# Patient Record
Sex: Male | Born: 1980 | Race: Black or African American | Hispanic: No | Marital: Single | Smoking: Current every day smoker
Health system: Southern US, Community
[De-identification: ages and names within clinical notes are randomized; demographics above are authoritative.]

## PROBLEM LIST (undated history)

## (undated) DIAGNOSIS — J302 Other seasonal allergic rhinitis: Secondary | ICD-10-CM

## (undated) HISTORY — DX: Other seasonal allergic rhinitis: J30.2

---

## 2004-11-20 ENCOUNTER — Emergency Department: Payer: Self-pay | Admitting: Internal Medicine

## 2005-06-28 ENCOUNTER — Emergency Department: Payer: Self-pay | Admitting: Internal Medicine

## 2005-07-06 ENCOUNTER — Emergency Department: Payer: Self-pay | Admitting: Emergency Medicine

## 2006-02-07 ENCOUNTER — Emergency Department: Payer: Self-pay | Admitting: Emergency Medicine

## 2006-08-22 ENCOUNTER — Emergency Department: Payer: Self-pay | Admitting: Emergency Medicine

## 2006-08-24 ENCOUNTER — Emergency Department: Payer: Self-pay | Admitting: Emergency Medicine

## 2006-10-21 ENCOUNTER — Emergency Department (HOSPITAL_COMMUNITY): Admission: EM | Admit: 2006-10-21 | Discharge: 2006-10-21 | Payer: Self-pay | Admitting: Emergency Medicine

## 2007-07-25 ENCOUNTER — Emergency Department: Payer: Self-pay | Admitting: Emergency Medicine

## 2007-08-22 ENCOUNTER — Emergency Department: Payer: Self-pay | Admitting: Emergency Medicine

## 2007-08-26 ENCOUNTER — Emergency Department: Payer: Self-pay | Admitting: Emergency Medicine

## 2008-04-05 ENCOUNTER — Emergency Department: Payer: Self-pay | Admitting: Emergency Medicine

## 2008-07-02 ENCOUNTER — Emergency Department: Payer: Self-pay | Admitting: Emergency Medicine

## 2009-01-23 ENCOUNTER — Emergency Department: Payer: Self-pay | Admitting: Internal Medicine

## 2009-05-20 ENCOUNTER — Emergency Department: Payer: Self-pay | Admitting: Internal Medicine

## 2009-08-15 ENCOUNTER — Emergency Department: Payer: Self-pay | Admitting: Emergency Medicine

## 2009-12-26 ENCOUNTER — Emergency Department: Payer: Self-pay | Admitting: Emergency Medicine

## 2010-07-08 ENCOUNTER — Emergency Department: Payer: Self-pay | Admitting: Emergency Medicine

## 2010-09-22 ENCOUNTER — Emergency Department: Payer: Self-pay | Admitting: Emergency Medicine

## 2010-11-03 ENCOUNTER — Emergency Department: Payer: Self-pay | Admitting: Emergency Medicine

## 2011-01-09 ENCOUNTER — Emergency Department: Payer: Self-pay | Admitting: Emergency Medicine

## 2011-04-26 ENCOUNTER — Emergency Department: Payer: Self-pay | Admitting: Internal Medicine

## 2011-11-10 ENCOUNTER — Emergency Department: Payer: Self-pay | Admitting: Emergency Medicine

## 2011-12-09 ENCOUNTER — Emergency Department: Payer: Self-pay | Admitting: Emergency Medicine

## 2013-06-14 ENCOUNTER — Emergency Department: Payer: Self-pay | Admitting: Emergency Medicine

## 2013-06-14 LAB — URINALYSIS, COMPLETE
Bilirubin,UR: NEGATIVE
Ketone: NEGATIVE
Protein: 100
RBC,UR: 5578 /HPF (ref 0–5)
Specific Gravity: 1.014 (ref 1.003–1.030)
WBC UR: 27 /HPF (ref 0–5)

## 2013-06-14 LAB — GC/CHLAMYDIA PROBE AMP

## 2013-06-17 LAB — URINE CULTURE

## 2013-11-10 ENCOUNTER — Emergency Department: Payer: Self-pay | Admitting: Emergency Medicine

## 2013-11-10 LAB — URINALYSIS, COMPLETE
Bilirubin,UR: NEGATIVE
Blood: NEGATIVE
Glucose,UR: NEGATIVE mg/dL (ref 0–75)
KETONE: NEGATIVE
Leukocyte Esterase: NEGATIVE
NITRITE: NEGATIVE
Ph: 5 (ref 4.5–8.0)
Protein: 100
RBC,UR: 10 /HPF (ref 0–5)
Specific Gravity: 1.039 (ref 1.003–1.030)
Squamous Epithelial: <1

## 2013-11-10 LAB — CBC WITH DIFFERENTIAL/PLATELET
Basophil #: 0 10*3/uL (ref 0.0–0.1)
Basophil %: 0.5 %
EOS PCT: 0.4 %
Eosinophil #: 0 10*3/uL (ref 0.0–0.7)
HCT: 44.6 % (ref 40.0–52.0)
HGB: 14.3 g/dL (ref 13.0–18.0)
LYMPHS ABS: 1.1 10*3/uL (ref 1.0–3.6)
Lymphocyte %: 14.3 %
MCH: 23.8 pg — ABNORMAL LOW (ref 26.0–34.0)
MCHC: 32.1 g/dL (ref 32.0–36.0)
MCV: 74 fL — AB (ref 80–100)
MONO ABS: 1.6 x10 3/mm — AB (ref 0.2–1.0)
MONOS PCT: 20.4 %
Neutrophil #: 5.1 10*3/uL (ref 1.4–6.5)
Neutrophil %: 64.4 %
Platelet: 171 10*3/uL (ref 150–440)
RBC: 6 10*6/uL — ABNORMAL HIGH (ref 4.40–5.90)
RDW: 16.3 % — AB (ref 11.5–14.5)
WBC: 7.8 10*3/uL (ref 3.8–10.6)

## 2013-11-10 LAB — COMPREHENSIVE METABOLIC PANEL
ALT: 41 U/L (ref 12–78)
Albumin: 3.8 g/dL (ref 3.4–5.0)
Alkaline Phosphatase: 99 U/L
Anion Gap: 5 — ABNORMAL LOW (ref 7–16)
BUN: 13 mg/dL (ref 7–18)
Bilirubin,Total: 0.2 mg/dL (ref 0.2–1.0)
CHLORIDE: 106 mmol/L (ref 98–107)
CO2: 22 mmol/L (ref 21–32)
Calcium, Total: 8.2 mg/dL — ABNORMAL LOW (ref 8.5–10.1)
Creatinine: 1.12 mg/dL (ref 0.60–1.30)
EGFR (African American): >60
EGFR (Non-African Amer.): >60
Glucose: 99 mg/dL (ref 65–99)
Osmolality: 267 (ref 275–301)
POTASSIUM: 3.7 mmol/L (ref 3.5–5.1)
SGOT(AST): 50 U/L — ABNORMAL HIGH (ref 15–37)
SODIUM: 133 mmol/L — AB (ref 136–145)
TOTAL PROTEIN: 8.3 g/dL — AB (ref 6.4–8.2)

## 2013-11-10 LAB — LIPASE, BLOOD: Lipase: 157 U/L (ref 73–393)

## 2014-01-08 ENCOUNTER — Emergency Department: Payer: Self-pay | Admitting: Emergency Medicine

## 2014-08-18 ENCOUNTER — Emergency Department: Payer: Self-pay | Admitting: Emergency Medicine

## 2016-04-02 ENCOUNTER — Encounter: Payer: Self-pay | Admitting: *Deleted

## 2016-04-02 ENCOUNTER — Emergency Department
Admission: EM | Admit: 2016-04-02 | Discharge: 2016-04-02 | Disposition: A | Discharge status: Home / Self Care | Payer: 59 | Attending: Emergency Medicine | Admitting: Emergency Medicine

## 2016-04-02 DIAGNOSIS — Y939 Activity, unspecified: Secondary | ICD-10-CM | POA: Insufficient documentation

## 2016-04-02 DIAGNOSIS — Y929 Unspecified place or not applicable: Secondary | ICD-10-CM | POA: Diagnosis not present

## 2016-04-02 DIAGNOSIS — Y999 Unspecified external cause status: Secondary | ICD-10-CM | POA: Insufficient documentation

## 2016-04-02 DIAGNOSIS — F17200 Nicotine dependence, unspecified, uncomplicated: Secondary | ICD-10-CM | POA: Insufficient documentation

## 2016-04-02 DIAGNOSIS — S46912A Strain of unspecified muscle, fascia and tendon at shoulder and upper arm level, left arm, initial encounter: Secondary | ICD-10-CM | POA: Diagnosis not present

## 2016-04-02 DIAGNOSIS — X58XXXA Exposure to other specified factors, initial encounter: Secondary | ICD-10-CM | POA: Diagnosis not present

## 2016-04-02 DIAGNOSIS — M25512 Pain in left shoulder: Secondary | ICD-10-CM | POA: Diagnosis present

## 2016-04-02 MED ORDER — NAPROXEN 500 MG PO TABS: 500.0000 mg | ORAL_TABLET | Freq: Two times a day (BID) | ORAL | 2 refills | Status: DC

## 2016-04-02 NOTE — ED Notes (Signed)
See triage note  Having left shoulder pain for about 1 week  Denies any injury  No deformity noted having some pain with touch and movement

## 2016-04-02 NOTE — ED Triage Notes (Signed)
Pt complains of left shoulder pain for 1 week, pt denies chest pain and any other symptoms

## 2016-04-02 NOTE — ED Provider Notes (Signed)
[facility] Emergency Department Provider Note   ____________________________________________    I have reviewed the triage vital signs and the nursing notes.   HISTORY  Chief Complaint Shoulder Pain     HPI Ricky Anderson Sr. is a 35 y.o. male who presents with approximate 1 week of mild left shoulder discomfort. Patient reports he has an aching that runs from his left lateral neck into his left shoulder. He denies trauma to the area. He denies repetitive use. No numbness or tingling. No headache. No erythema or skin break. He is able to move his arm fully but reports it is uncomfortable at times.   History reviewed. No pertinent past medical history.  There are no active problems to display for this patient.   History reviewed. No pertinent surgical history.  Prior to Admission medications   Medication Sig Start Date End Date Taking? Authorizing Provider  naproxen (NAPROSYN) 500 MG tablet Take 1 tablet (500 mg total) by mouth 2 (two) times daily with a meal. 04/02/16   Jene Everyobert Livi Mcgann, MD     Allergies Review of patient's allergies indicates no known allergies.  No family history on file.  Social History Social History  Substance Use Topics  . Smoking status: Current Every Day Smoker    Packs/day: 1.00  . Smokeless tobacco: Never Used  . Alcohol use Yes    Review of Systems  Constitutional: No fever/chills       Musculoskeletal: As above Skin: Negative for rash. Neurological: Negative for headaches     ____________________________________________   PHYSICAL EXAM:  VITAL SIGNS: ED Triage Vitals  Enc Vitals Group     BP 04/02/16 1016 128/90     Pulse Rate 04/02/16 1016 85     Resp 04/02/16 1016 20     Temp 04/02/16 1016 98.1 F (36.7 C)     Temp Source 04/02/16 1016 Oral     SpO2 04/02/16 1016 96 %     Weight 04/02/16 1013 190 lb (86.2 kg)     Height 04/02/16 1013 6' (1.829 m)     Head Circumference --      Peak  Flow --      Pain Score 04/02/16 1013 8     Pain Loc --      Pain Edu? --      Excl. in GC? --      Constitutional: Alert and oriented. No acute distress. Pleasant and interactive Eyes: Conjunctivae are normal.  Head: Atraumatic. Nose: No congestion/rhinnorhea. Mouth/Throat: Mucous membranes are moist.   Cardiovascular: Normal rate, regular rhythm.  Respiratory: Normal respiratory effort.  No retractions. Genitourinary: deferred Musculoskeletal: Patient with mild tenderness to palpation along the left lateral trapezius muscle. Full range of motion both active and passive of the left arm. No clavicular tenderness to palpation. No tenderness to the scapula. 2+ distal pulses. Neurologic:  Normal speech and language. No gross focal neurologic deficits are appreciated.   Skin:  Skin is warm, dry and intact. No rash noted.   ____________________________________________   LABS (all labs ordered are listed, but only abnormal results are displayed)  Labs Reviewed - No data to display ____________________________________________  EKG   ____________________________________________  RADIOLOGY  None ____________________________________________   PROCEDURES  Procedure(s) performed: No    Critical Care performed: No ____________________________________________   INITIAL IMPRESSION / ASSESSMENT AND PLAN / ED COURSE  Pertinent labs & imaging results that were available during my care of the patient were reviewed by me and considered  in my medical decision making (see chart for details).  Patient is examined primarily consistent with trapezius muscle versus shoulder strain. We will treat conservatively with NSAIDs and ice packs. Follow-up with PCP as needed.   ____________________________________________   FINAL CLINICAL IMPRESSION(S) / ED DIAGNOSES  Final diagnoses:  Left shoulder strain, initial encounter      NEW MEDICATIONS STARTED DURING THIS VISIT:  New  Prescriptions   NAPROXEN (NAPROSYN) 500 MG TABLET    Take 1 tablet (500 mg total) by mouth 2 (two) times daily with a meal.     Note:  This document was prepared using Dragon voice recognition software and may include unintentional dictation errors.    Jene Every, MD 04/02/16 1027

## 2016-06-02 ENCOUNTER — Encounter: Payer: Self-pay | Admitting: Intensive Care

## 2016-06-02 ENCOUNTER — Emergency Department: Payer: No Typology Code available for payment source

## 2016-06-02 ENCOUNTER — Emergency Department
Admission: EM | Admit: 2016-06-02 | Discharge: 2016-06-03 | Disposition: A | Discharge status: Home / Self Care | Payer: No Typology Code available for payment source | Attending: Student | Admitting: Student

## 2016-06-02 DIAGNOSIS — Z23 Encounter for immunization: Secondary | ICD-10-CM | POA: Insufficient documentation

## 2016-06-02 DIAGNOSIS — Y939 Activity, unspecified: Secondary | ICD-10-CM | POA: Insufficient documentation

## 2016-06-02 DIAGNOSIS — Y92410 Unspecified street and highway as the place of occurrence of the external cause: Secondary | ICD-10-CM | POA: Insufficient documentation

## 2016-06-02 DIAGNOSIS — S0990XA Unspecified injury of head, initial encounter: Secondary | ICD-10-CM | POA: Insufficient documentation

## 2016-06-02 DIAGNOSIS — S39012A Strain of muscle, fascia and tendon of lower back, initial encounter: Secondary | ICD-10-CM | POA: Diagnosis not present

## 2016-06-02 DIAGNOSIS — Y999 Unspecified external cause status: Secondary | ICD-10-CM | POA: Diagnosis not present

## 2016-06-02 DIAGNOSIS — F17210 Nicotine dependence, cigarettes, uncomplicated: Secondary | ICD-10-CM | POA: Diagnosis not present

## 2016-06-02 DIAGNOSIS — S29012A Strain of muscle and tendon of back wall of thorax, initial encounter: Secondary | ICD-10-CM | POA: Insufficient documentation

## 2016-06-02 DIAGNOSIS — T148XXA Other injury of unspecified body region, initial encounter: Secondary | ICD-10-CM

## 2016-06-02 MED ORDER — OXYCODONE HCL 5 MG PO TABS
5.0000 mg | ORAL_TABLET | Freq: Once | ORAL | Status: AC
  Administered 2016-06-03: 5 mg via ORAL
  Filled 2016-06-02: qty 1

## 2016-06-02 MED ORDER — TETANUS-DIPHTH-ACELL PERTUSSIS 5-2.5-18.5 LF-MCG/0.5 IM SUSP
0.5000 mL | Freq: Once | INTRAMUSCULAR | Status: AC
  Administered 2016-06-03: 0.5 mL via INTRAMUSCULAR
  Filled 2016-06-02: qty 0.5

## 2016-06-02 NOTE — ED Notes (Signed)
Pt at CT
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
## 2016-06-03 DIAGNOSIS — S0990XA Unspecified injury of head, initial encounter: Secondary | ICD-10-CM | POA: Diagnosis not present
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
## 2017-05-20 ENCOUNTER — Emergency Department
Admission: EM | Admit: 2017-05-20 | Discharge: 2017-05-20 | Disposition: A | Discharge status: Home / Self Care | Payer: 59 | Attending: Emergency Medicine | Admitting: Emergency Medicine

## 2017-05-20 ENCOUNTER — Encounter: Payer: Self-pay | Admitting: Emergency Medicine

## 2017-05-20 DIAGNOSIS — R1084 Generalized abdominal pain: Secondary | ICD-10-CM | POA: Insufficient documentation

## 2017-05-20 LAB — URINALYSIS, COMPLETE (UACMP) WITH MICROSCOPIC
BILIRUBIN URINE: NEGATIVE
Glucose, UA: NEGATIVE mg/dL
Ketones, ur: NEGATIVE mg/dL
Leukocytes, UA: NEGATIVE
Nitrite: NEGATIVE
Protein, ur: 30 mg/dL — AB
SQUAMOUS EPITHELIAL / LPF: NONE SEEN
Specific Gravity, Urine: 1.01 (ref 1.005–1.030)
pH: 6 (ref 5.0–8.0)
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
## 2017-09-09 ENCOUNTER — Other Ambulatory Visit: Payer: Self-pay

## 2017-09-09 ENCOUNTER — Emergency Department
Admission: EM | Admit: 2017-09-09 | Discharge: 2017-09-09 | Disposition: A | Discharge status: Home / Self Care | Payer: 59 | Attending: Emergency Medicine | Admitting: Emergency Medicine

## 2017-09-09 ENCOUNTER — Encounter: Payer: Self-pay | Admitting: Emergency Medicine

## 2017-09-09 DIAGNOSIS — R300 Dysuria: Secondary | ICD-10-CM | POA: Insufficient documentation

## 2017-09-09 DIAGNOSIS — R109 Unspecified abdominal pain: Secondary | ICD-10-CM

## 2017-09-09 DIAGNOSIS — R1031 Right lower quadrant pain: Secondary | ICD-10-CM | POA: Diagnosis not present

## 2017-09-09 DIAGNOSIS — F17210 Nicotine dependence, cigarettes, uncomplicated: Secondary | ICD-10-CM | POA: Diagnosis not present

## 2017-09-09 LAB — URINALYSIS, COMPLETE (UACMP) WITH MICROSCOPIC
BILIRUBIN URINE: NEGATIVE
Bacteria, UA: NONE SEEN
Glucose, UA: NEGATIVE mg/dL
HGB URINE DIPSTICK: NEGATIVE
Ketones, ur: NEGATIVE mg/dL
Leukocytes, UA: NEGATIVE
NITRITE: NEGATIVE
Protein, ur: NEGATIVE mg/dL
RBC / HPF: NONE SEEN RBC/hpf (ref 0–5)
SPECIFIC GRAVITY, URINE: 1.01 (ref 1.005–1.030)
Squamous Epithelial / LPF: NONE SEEN
pH: 6 (ref 5.0–8.0)

## 2017-09-09 LAB — CHLAMYDIA/NGC RT PCR (ARMC ONLY)
Chlamydia Tr: NOT DETECTED
N gonorrhoeae: NOT DETECTED

## 2017-09-09 NOTE — Discharge Instructions (Signed)
Fortunately today your urine test looked very normal.  I will call you back with the results of the other test we discussed.  Please make sure you remain well-hydrated and establish care with primary care for reevaluation.  Return to the emergency department for any concerns.  It was a pleasure to take care of you today, and thank you for coming to our emergency department.  If you have any questions or concerns before leaving please ask the nurse to grab me and I'm more than happy to go through your aftercare instructions again.  If you were prescribed any opioid pain medication today such as Norco, Vicodin, Percocet, morphine, hydrocodone, or oxycodone please make sure you do not drive when you are taking this medication as it can alter your ability to drive safely.  If you have any concerns once you are home that you are not improving or are in fact getting worse before you can make it to your follow-up appointment, please do not hesitate to call 911 and come back for further evaluation.  Merrily BrittleNeil Curley Fayette, MD  Results for orders placed or performed during the hospital encounter of 09/09/17  Urinalysis, Complete w Microscopic  Result Value Ref Range   Color, Urine STRAW (A) YELLOW   APPearance CLEAR (A) CLEAR   Specific Gravity, Urine 1.010 1.005 - 1.030   pH 6.0 5.0 - 8.0   Glucose, UA NEGATIVE NEGATIVE mg/dL   Hgb urine dipstick NEGATIVE NEGATIVE   Bilirubin Urine NEGATIVE NEGATIVE   Ketones, ur NEGATIVE NEGATIVE mg/dL   Protein, ur NEGATIVE NEGATIVE mg/dL   Nitrite NEGATIVE NEGATIVE   Leukocytes, UA NEGATIVE NEGATIVE   RBC / HPF NONE SEEN 0 - 5 RBC/hpf   WBC, UA 0-5 0 - 5 WBC/hpf   Bacteria, UA NONE SEEN NONE SEEN   Squamous Epithelial / LPF NONE SEEN NONE SEEN

## 2017-09-09 NOTE — ED Provider Notes (Addendum)
[facility] Emergency Department Provider Note  ____________________________________________   First MD Initiated Contact with Patient 09/09/17 [phone]     (approximate)  I have reviewed the triage vital signs and the nursing notes.   HISTORY  Chief Complaint Flank Pain   HPI Ricky Anderson. is a 37 y.o. male who self presents to the emergency department with roughly 1 week of intermittent mild severity dysuria and urinary frequency.  Is not every time he urinates.  He also has about 24 hours of insidious onset intermittent mild severity right low back throbbing discomfort.  He denies hematuria.  He says that he did have a urinary tract infection 2-3 years ago that cleared up with antibiotics.  He is sexually monogamous with one partner.  He does not use condoms.  He says he has never had a sexually transmitted disease.  He does have some mild throbbing in his bilateral testicles intermittently.  He denies fevers or chills.  He denies penile discharge.  He denies abdominal pain nausea or vomiting.  History reviewed. No pertinent past medical history.  There are no active problems to display for this patient.   History reviewed. No pertinent surgical history.  Prior to Admission medications   Medication Sig Start Date End Date Taking? Authorizing Provider  naproxen (NAPROSYN) 500 MG tablet Take 1 tablet (500 mg total) by mouth 2 (two) times daily with a meal. 04/02/16   Jene Every, MD    Allergies Patient has no known allergies.  History reviewed. No pertinent family history.  Social History Social History   Tobacco Use  . Smoking status: Current Every Day Smoker    Packs/day: 0.50    Types: Cigarettes  . Smokeless tobacco: Never Used  Substance Use Topics  . Alcohol use: Yes    Alcohol/week: 1.2 oz    Types: 2 Cans of beer per week    Comment: per day, liquor on the weekends  . Drug use: No    Review of Systems Constitutional: No  fever/chills ENT: No sore throat. Cardiovascular: Denies chest pain. Respiratory: Denies shortness of breath. Gastrointestinal: No abdominal pain.  No nausea, no vomiting.  No diarrhea.  No constipation. Musculoskeletal: Negative for back pain. Neurological: Negative for headaches   ____________________________________________   PHYSICAL EXAM:  VITAL SIGNS: ED Triage Vitals  Enc Vitals Group     BP 09/09/17 0751 (!) 155/91     Pulse Rate 09/09/17 0751 87     Resp 09/09/17 0751 18     Temp 09/09/17 0751 98 F (36.7 C)     Temp Source 09/09/17 0751 Oral     SpO2 09/09/17 0751 100 %     Weight 09/09/17 0751 190 lb (86.2 kg)     Height 09/09/17 0751 6' (1.829 m)     Head Circumference --      Peak Flow --      Pain Score 09/09/17 0750 7     Pain Loc --      Pain Edu? --      Excl. in GC? --     Constitutional: Alert and oriented x4 well-appearing nontoxic no diaphoresis speaks in full clear sentences Head: Atraumatic. Nose: No congestion/rhinnorhea. Mouth/Throat: No trismus Neck: No stridor.   Cardiovascular: Regular rate and rhythm Respiratory: Normal respiratory effort.  No retractions. Gastrointestinal: Soft nondistended nontender no rebound or guarding no peritonitis no costovertebral tenderness Normal testes and normal line no tenderness circumcised phallus with no discharge Neurologic:  Normal speech  and language. No gross focal neurologic deficits are appreciated.  Skin:  Skin is warm, dry and intact. No rash noted.    ____________________________________________  LABS (all labs ordered are listed, but only abnormal results are displayed)  Labs Reviewed  URINALYSIS, COMPLETE (UACMP) WITH MICROSCOPIC - Abnormal; Notable for the following components:      Result Value   Color, Urine STRAW (*)    APPearance CLEAR (*)    All other components within normal limits  CHLAMYDIA/NGC RT PCR ([facility] ONLY)    Urinalysis reviewed by me with no acute  disease __________________________________________  EKG   ____________________________________________  RADIOLOGY   ____________________________________________   DIFFERENTIAL includes but not limited to  Gonococcal urethritis, chlamydia urethritis, urinary tract infection, pyelonephritis, prostatitis   PROCEDURES  Procedure(s) performed: no  Procedures  Critical Care performed: no  Observation: no ____________________________________________   INITIAL IMPRESSION / ASSESSMENT AND PLAN / ED COURSE  Pertinent labs & imaging results that were available during my care of the patient were reviewed by me and considered in my medical decision making (see chart for details).  The patient is hemodynamically stable quite well-appearing.  His lower urinary tract symptoms are more consistent with a sexually transmitted infection than a true UTI.     ----------------------------------------- 9:02 AM on 09/09/2017 -----------------------------------------  Fortunately the patient's urinalysis is very normal.  I had a lengthy discussion with the patient regarding the diagnostic uncertainty but that I did not believe he required antibiotics at this time.  I will call him back with the results of the GC/CT.  He is discharged home in good condition verbalizes understanding and agree with plan. ____________________________________________   FINAL CLINICAL IMPRESSION(S) / ED DIAGNOSES  Final diagnoses:  Dysuria  Right flank pain      NEW MEDICATIONS STARTED DURING THIS VISIT:  New Prescriptions   No medications on file     Note:  This document was prepared using Dragon voice recognition software and may include unintentional dictation errors.      Merrily Brittleifenbark, Shallon Yaklin, MD 09/09/17 [id number]    Merrily Brittleifenbark, Nichole Keltner, MD 09/09/17 [phone]

## 2017-09-09 NOTE — ED Notes (Signed)

## 2017-09-09 NOTE — ED Triage Notes (Signed)
Pt c/o R flank pain. Pt also c/o intermittent dysuria at this time. Pt denies discharge from penile area. Pt states hx of UTI with same symptoms.

## 2017-09-18 ENCOUNTER — Other Ambulatory Visit: Payer: Self-pay

## 2017-09-18 ENCOUNTER — Encounter: Payer: Self-pay | Admitting: Emergency Medicine

## 2017-09-18 ENCOUNTER — Emergency Department
Admission: EM | Admit: 2017-09-18 | Discharge: 2017-09-18 | Disposition: A | Discharge status: Home / Self Care | Payer: 59 | Attending: Emergency Medicine | Admitting: Emergency Medicine

## 2017-09-18 DIAGNOSIS — M791 Myalgia, unspecified site: Secondary | ICD-10-CM | POA: Diagnosis not present

## 2017-09-18 DIAGNOSIS — R51 Headache: Secondary | ICD-10-CM | POA: Diagnosis not present

## 2017-09-18 DIAGNOSIS — J101 Influenza due to other identified influenza virus with other respiratory manifestations: Secondary | ICD-10-CM | POA: Insufficient documentation

## 2017-09-18 DIAGNOSIS — F17210 Nicotine dependence, cigarettes, uncomplicated: Secondary | ICD-10-CM | POA: Diagnosis not present

## 2017-09-18 LAB — INFLUENZA PANEL BY PCR (TYPE A & B)
Influenza A By PCR: POSITIVE — AB
Influenza B By PCR: NEGATIVE

## 2017-09-18 MED ORDER — OSELTAMIVIR PHOSPHATE 75 MG PO CAPS: 75.0000 mg | ORAL_CAPSULE | Freq: Two times a day (BID) | ORAL | 0 refills | Status: AC

## 2017-09-18 NOTE — ED Triage Notes (Signed)
States he developed body aches and fever yesterday   Unsure of how high the fever was

## 2017-09-18 NOTE — ED Provider Notes (Signed)
[facility] Emergency Department Provider Note  ____________________________________________   First MD Initiated Contact with Patient 09/18/17 1209     (approximate)  I have reviewed the triage vital signs and the nursing notes.   HISTORY  Chief Complaint Generalized Body Aches   HPI Ricky Anderson. is a 37 y.o. male is here with complaint of sudden onset of body aches and fever yesterday.  Patient his temperature at home is unsure how high it was.  He states he did not get the flu shot this year.  He is a current smoker.  He is unaware of any exposure to the flu.   History reviewed. No pertinent past medical history.  There are no active problems to display for this patient.   History reviewed. No pertinent surgical history.  Prior to Admission medications   Medication Sig Start Date End Date Taking? Authorizing Provider  oseltamivir (TAMIFLU) 75 MG capsule Take 1 capsule (75 mg total) by mouth 2 (two) times daily for 5 days. 09/18/17 09/23/17  Tommi Rumps, PA-C    Allergies Patient has no known allergies.  No family history on file.  Social History Social History   Tobacco Use  . Smoking status: Current Every Day Smoker    Packs/day: 0.50    Types: Cigarettes  . Smokeless tobacco: Never Used  Substance Use Topics  . Alcohol use: Yes    Alcohol/week: 1.2 oz    Types: 2 Cans of beer per week    Comment: per day, liquor on the weekends  . Drug use: No    Review of Systems Constitutional: Subjective fever/chills Eyes: No visual changes. ENT: No sore throat. Cardiovascular: Denies chest pain. Respiratory: Denies shortness of breath. Gastrointestinal: No abdominal pain.  No nausea, no vomiting.  No diarrhea.  Musculoskeletal: Positive for body aches. Skin: Negative for rash. Neurological: Positive for headaches, no focal weakness or numbness. ___________________________________________   PHYSICAL EXAM:  VITAL SIGNS: ED  Triage Vitals  Enc Vitals Group     BP 09/18/17 1133 129/82     Pulse Rate 09/18/17 1133 91     Resp --      Temp 09/18/17 1133 98.6 F (37 C)     Temp Source 09/18/17 1133 Oral     SpO2 09/18/17 1133 99 %     Weight 09/18/17 1141 190 lb (86.2 kg)     Height 09/18/17 1141 6' (1.829 m)     Head Circumference --      Peak Flow --      Pain Score --      Pain Loc --      Pain Edu? --      Excl. in GC? --    Constitutional: Alert and oriented. Well appearing and in no acute distress. Eyes: Conjunctivae are normal.  Head: Atraumatic. Nose: Mild congestion/rhinnorhea. Mouth/Throat: Mucous membranes are moist.  Oropharynx non-erythematous.   Neck: No stridor.   Hematological/Lymphatic/Immunilogical: No cervical lymphadenopathy. Cardiovascular: Normal rate, regular rhythm. Grossly normal heart sounds.  Good peripheral circulation. Respiratory: Normal respiratory effort.  No retractions. Lungs CTAB. Musculoskeletal: Moves upper and lower extremities without any difficulty. Neurologic:  Normal speech and language. No gross focal neurologic deficits are appreciated. No gait instability. Skin:  Skin is warm, dry and intact. No rash noted. Psychiatric: Mood and affect are normal. Speech and behavior are normal.  ____________________________________________   LABS (all labs ordered are listed, but only abnormal results are displayed)  Labs Reviewed  INFLUENZA PANEL  BY PCR (TYPE A & B) - Abnormal; Notable for the following components:      Result Value   Influenza A By PCR POSITIVE (*)    All other components within normal limits     PROCEDURES  Procedure(s) performed: None  Procedures  Critical Care performed: No  ____________________________________________   INITIAL IMPRESSION / ASSESSMENT AND PLAN / ED COURSE Patient was made aware that he was positive for influenza A.  He was given a prescription for Tamiflu to begin taking day.  He will  increase fluids, take Tylenol  or ibuprofen as needed for fever and body aches.  He will need to follow-up with [facility] if any continued problems.  He was also given a note to remain out of work.  ____________________________________________   FINAL CLINICAL IMPRESSION(S) / ED DIAGNOSES  Final diagnoses:  Influenza A     ED Discharge Orders        Ordered    oseltamivir (TAMIFLU) 75 MG capsule  2 times daily     09/18/17 1301       Note:  This document was prepared using Dragon voice recognition software and may include unintentional dictation errors.    Tommi RumpsSummers, Rhonda L, PA-C 09/18/17 1304    Tommi RumpsSummers, Rhonda L, PA-C 09/18/17 1404    Schaevitz, Myra Rudeavid Matthew, MD 09/18/17 [phone]

## 2017-09-18 NOTE — Discharge Instructions (Signed)
Follow-up with [facility] if any continued problems.  Increase fluids.  Tylenol or ibuprofen as needed for fever or body aches.  Begin taking Tamiflu twice daily for the next 5 days.

## 2017-10-08 ENCOUNTER — Ambulatory Visit (INDEPENDENT_AMBULATORY_CARE_PROVIDER_SITE_OTHER): Payer: 59 | Admitting: Family Medicine

## 2017-10-08 ENCOUNTER — Encounter: Payer: Self-pay | Admitting: Family Medicine

## 2017-10-08 VITALS — BP 138/84 | HR 97 | Temp 98.1°F | Wt 201.6 lb

## 2017-10-08 DIAGNOSIS — J0110 Acute frontal sinusitis, unspecified: Secondary | ICD-10-CM

## 2017-10-08 DIAGNOSIS — F419 Anxiety disorder, unspecified: Secondary | ICD-10-CM

## 2017-10-08 DIAGNOSIS — Z7689 Persons encountering health services in other specified circumstances: Secondary | ICD-10-CM

## 2017-10-08 DIAGNOSIS — J309 Allergic rhinitis, unspecified: Secondary | ICD-10-CM | POA: Diagnosis not present

## 2017-10-08 MED ORDER — AMOXICILLIN-POT CLAVULANATE 875-125 MG PO TABS: 1.0000 | ORAL_TABLET | Freq: Two times a day (BID) | ORAL | 0 refills | Status: DC

## 2017-10-08 MED ORDER — FLUTICASONE PROPIONATE 50 MCG/ACT NA SUSP: 2.0000 | Freq: Two times a day (BID) | NASAL | 11 refills | Status: DC

## 2017-10-08 MED ORDER — LEVOCETIRIZINE DIHYDROCHLORIDE 5 MG PO TABS: 5.0000 mg | ORAL_TABLET | Freq: Every day | ORAL | 11 refills | Status: DC

## 2017-10-08 NOTE — Patient Instructions (Addendum)
Counseling is always a great option to help with frequent anxiety episodes - you can go to [employer] and type in your zip code and health insurance to find local providers you would be covered to see   Buspirone tablets What is this medicine? BUSPIRONE (byoo SPYE rone) is used to treat anxiety disorders. This medicine may be used for other purposes; ask your health care provider or pharmacist if you have questions. COMMON BRAND NAME(S): BuSpar What should I tell my health care provider before I take this medicine? They need to know if you have any of these conditions: -kidney or liver disease -an unusual or allergic reaction to buspirone, other medicines, foods, dyes, or preservatives -pregnant or trying to get pregnant -breast-feeding How should I use this medicine? Take this medicine by mouth with a glass of water. Follow the directions on the prescription label. You may take this medicine with or without food. To ensure that this medicine always works the same way for you, you should take it either always with or always without food. Take your doses at regular intervals. Do not take your medicine more often than directed. Do not stop taking except on the advice of your doctor or health care professional. Talk to your pediatrician regarding the use of this medicine in children. Special care may be needed. Overdosage: If you think you have taken too much of this medicine contact a poison control center or emergency room at once. NOTE: This medicine is only for you. Do not share this medicine with others. What if I miss a dose? If you miss a dose, take it as soon as you can. If it is almost time for your next dose, take only that dose. Do not take double or extra doses. What may interact with this medicine? Do not take this medicine with any of the following medications: -linezolid -MAOIs like Carbex, Eldepryl, Marplan, Nardil, and Parnate -methylene blue -procarbazine This medicine  may also interact with the following medications: -diazepam -digoxin -diltiazem -erythromycin -grapefruit juice -haloperidol -medicines for mental depression or mood problems -medicines for seizures like carbamazepine, phenobarbital and phenytoin -nefazodone -other medications for anxiety -rifampin -ritonavir -some antifungal medicines like itraconazole, ketoconazole, and voriconazole -verapamil -warfarin This list may not describe all possible interactions. Give your health care provider a list of all the medicines, herbs, non-prescription drugs, or dietary supplements you use. Also tell them if you smoke, drink alcohol, or use illegal drugs. Some items may interact with your medicine. What should I watch for while using this medicine? Visit your doctor or health care professional for regular checks on your progress. It may take 1 to 2 weeks before your anxiety gets better. You may get drowsy or dizzy. Do not drive, use machinery, or do anything that needs mental alertness until you know how this drug affects you. Do not stand or sit up quickly, especially if you are an older patient. This reduces the risk of dizzy or fainting spells. Alcohol can make you more drowsy and dizzy. Avoid alcoholic drinks. What side effects may I notice from receiving this medicine? Side effects that you should report to your doctor or health care professional as soon as possible: -blurred vision or other vision changes -chest pain -confusion -difficulty breathing -feelings of hostility or anger -muscle aches and pains -numbness or tingling in hands or feet -ringing in the ears -skin rash and itching -vomiting -weakness Side effects that usually do not require medical attention (report to your doctor or health  care professional if they continue or are bothersome): -disturbed dreams, nightmares -headache -nausea -restlessness or nervousness -sore throat and nasal congestion -stomach upset This list  may not describe all possible side effects. Call your doctor for medical advice about side effects. You may report side effects to FDA at 1-800-FDA-1088. Where should I keep my medicine? Keep out of the reach of children. Store at room temperature below 30 degrees C (86 degrees F). Protect from light. Keep container tightly closed. Throw away any unused medicine after the expiration date. NOTE: This sheet is a summary. It may not cover all possible information. If you have questions about this medicine, talk to your doctor, pharmacist, or health care provider.  2018 Elsevier/Gold Standard (2010-03-17 18:06:11) Fluoxetine capsules or tablets (Depression/Mood Disorders) What is this medicine? FLUOXETINE (floo OX e teen) belongs to a class of drugs known as selective serotonin reuptake inhibitors (SSRIs). It helps to treat mood problems such as depression, obsessive compulsive disorder, and panic attacks. It can also treat certain eating disorders. This medicine may be used for other purposes; ask your health care provider or pharmacist if you have questions. COMMON BRAND NAME(S): Prozac What should I tell my health care provider before I take this medicine? They need to know if you have any of these conditions: -bipolar disorder or a family history of bipolar disorder -bleeding disorders -glaucoma -heart disease -liver disease -low levels of sodium in the blood -seizures -suicidal thoughts, plans, or attempt; a previous suicide attempt by you or a family member -take MAOIs like Carbex, Eldepryl, Marplan, Nardil, and Parnate -take medicines that treat or prevent blood clots -thyroid disease -an unusual or allergic reaction to fluoxetine, other medicines, foods, dyes, or preservatives -pregnant or trying to get pregnant -breast-feeding How should I use this medicine? Take this medicine by mouth with a glass of water. Follow the directions on the prescription label. You can take this medicine  with or without food. Take your medicine at regular intervals. Do not take it more often than directed. Do not stop taking this medicine suddenly except upon the advice of your doctor. Stopping this medicine too quickly may cause serious side effects or your condition may worsen. A special MedGuide will be given to you by the pharmacist with each prescription and refill. Be sure to read this information carefully each time. Talk to your pediatrician regarding the use of this medicine in children. While this drug may be prescribed for children as young as 7 years for selected conditions, precautions do apply. Overdosage: If you think you have taken too much of this medicine contact a poison control center or emergency room at once. NOTE: This medicine is only for you. Do not share this medicine with others. What if I miss a dose? If you miss a dose, skip the missed dose and go back to your regular dosing schedule. Do not take double or extra doses. What may interact with this medicine? Do not take this medicine with any of the following medications: -other medicines containing fluoxetine, like Sarafem or Symbyax -cisapride -linezolid -MAOIs like Carbex, Eldepryl, Marplan, Nardil, and Parnate -methylene blue (injected into a vein) -pimozide -thioridazine This medicine may also interact with the following medications: -alcohol -amphetamines -aspirin and aspirin-like medicines -carbamazepine -certain medicines for depression, anxiety, or psychotic disturbances -certain medicines for migraine headaches like almotriptan, eletriptan, frovatriptan, naratriptan, rizatriptan, sumatriptan, zolmitriptan -digoxin -diuretics -fentanyl -flecainide -furazolidone -isoniazid -lithium -medicines for sleep -medicines that treat or prevent blood clots like warfarin, enoxaparin, and  dalteparin -NSAIDs, medicines for pain and inflammation, like ibuprofen or  naproxen -phenytoin -procarbazine -propafenone -rasagiline -ritonavir -supplements like St. John's wort, kava kava, valerian -tramadol -tryptophan -vinblastine This list may not describe all possible interactions. Give your health care provider a list of all the medicines, herbs, non-prescription drugs, or dietary supplements you use. Also tell them if you smoke, drink alcohol, or use illegal drugs. Some items may interact with your medicine. What should I watch for while using this medicine? Tell your doctor if your symptoms do not get better or if they get worse. Visit your doctor or health care professional for regular checks on your progress. Because it may take several weeks to see the full effects of this medicine, it is important to continue your treatment as prescribed by your doctor. Patients and their families should watch out for new or worsening thoughts of suicide or depression. Also watch out for sudden changes in feelings such as feeling anxious, agitated, panicky, irritable, hostile, aggressive, impulsive, severely restless, overly excited and hyperactive, or not being able to sleep. If this happens, especially at the beginning of treatment or after a change in dose, call your health care professional. Bonita Quin may get drowsy or dizzy. Do not drive, use machinery, or do anything that needs mental alertness until you know how this medicine affects you. Do not stand or sit up quickly, especially if you are an older patient. This reduces the risk of dizzy or fainting spells. Alcohol may interfere with the effect of this medicine. Avoid alcoholic drinks. Your mouth may get dry. Chewing sugarless gum or sucking hard candy, and drinking plenty of water may help. Contact your doctor if the problem does not go away or is severe. This medicine may affect blood sugar levels. If you have diabetes, check with your doctor or health care professional before you change your diet or the dose of your  diabetic medicine. What side effects may I notice from receiving this medicine? Side effects that you should report to your doctor or health care professional as soon as possible: -allergic reactions like skin rash, itching or hives, swelling of the face, lips, or tongue -anxious -black, tarry stools -breathing problems -changes in vision -confusion -elevated mood, decreased need for sleep, racing thoughts, impulsive behavior -eye pain -fast, irregular heartbeat -feeling faint or lightheaded, falls -feeling agitated, angry, or irritable -hallucination, loss of contact with reality -loss of balance or coordination -loss of memory -painful or prolonged erections -restlessness, pacing, inability to keep still -seizures -stiff muscles -suicidal thoughts or other mood changes -trouble sleeping -unusual bleeding or bruising -unusually weak or tired -vomiting Side effects that usually do not require medical attention (report to your doctor or health care professional if they continue or are bothersome): -change in appetite or weight -change in sex drive or performance -diarrhea -dry mouth -headache -increased sweating -nausea -tremors This list may not describe all possible side effects. Call your doctor for medical advice about side effects. You may report side effects to FDA at 1-800-FDA-1088. Where should I keep my medicine? Keep out of the reach of children. Store at room temperature between 15 and 30 degrees C (59 and 86 degrees F). Throw away any unused medicine after the expiration date. NOTE: This sheet is a summary. It may not cover all possible information. If you have questions about this medicine, talk to your doctor, pharmacist, or health care provider.  2018 Elsevier/Gold Standard (2016-01-08 15:55:27)

## 2017-10-08 NOTE — Progress Notes (Signed)
BP 138/84   Pulse 97   Temp 98.1 F (36.7 C) (Tympanic)   Wt 201 lb 9.6 oz (91.4 kg)   SpO2 99%   BMI 27.34 kg/m    Subjective:    Patient ID: Ricky Anderson., male    DOB: 04-27-1981, 37 y.o.   MRN: [id number]  HPI: [name]. is a 37 y.o. male  Chief Complaint  Patient presents with  . Establish Care  . Sinusitis  . Seasonal Allergies   Pt here today to establish care. Only known medical hx is allergic rhinitis, not currently taking anything other than prn benadryl.   Has been having worsening congestion, sinus pain and pressure, HAs, and irritated throat x 1+ weeks. Denies fever, chills, body aches, wheezing, SOB. Benadryl not helping. No known sick contacts.    Also having some issues with anxiety. States it comes with chest tightness and some SOB and palpitations when certain situations arise. Does not feel he is managing things well on his own anymore, and episodes are coming at least once a day at this point.   Relevant past medical, surgical, family and social history reviewed and updated as indicated. Interim medical history since our last visit reviewed. Allergies and medications reviewed and updated.  Review of Systems  Per HPI unless specifically indicated above     Objective:    BP 138/84   Pulse 97   Temp 98.1 F (36.7 C) (Tympanic)   Wt 201 lb 9.6 oz (91.4 kg)   SpO2 99%   BMI 27.34 kg/m   Wt Readings from Last 3 Encounters:  10/08/17 201 lb 9.6 oz (91.4 kg)  09/18/17 190 lb (86.2 kg)  09/09/17 190 lb (86.2 kg)    Physical Exam  Constitutional: He is oriented to person, place, and time. He appears well-developed and well-nourished. No distress.  HENT:  Head: Atraumatic.  B/l middle ear effusion with TM injection Frontal sinuses ttp b/l Oropharynx erythematous and edematous Nasal mucosa significantly erythematous, boggy, with thick discharge present  Eyes: Conjunctivae are normal. Pupils are equal, round, and reactive to light. No  scleral icterus.  Neck: Normal range of motion. Neck supple.  Cardiovascular: Normal rate and normal heart sounds.  Pulmonary/Chest: Effort normal and breath sounds normal. No respiratory distress. He has no wheezes.  Musculoskeletal: Normal range of motion.  Neurological: He is alert and oriented to person, place, and time.  Skin: Skin is warm and dry.  Psychiatric: He has a normal mood and affect. His behavior is normal.  Vitals reviewed.   Results for orders placed or performed during the hospital encounter of 09/18/17  Influenza panel by PCR (type A & B)  Result Value Ref Range   Influenza A By PCR POSITIVE (A) NEGATIVE   Influenza B By PCR NEGATIVE NEGATIVE      Assessment & Plan:   Problem List Items Addressed This Visit      Respiratory   Allergic rhinitis - Primary    Will start good allergy regimen with xyzal and flonase daily. Sinus rinses prn, humidifiers at night.        Other Visit Diagnoses    Encounter to establish care       Acute frontal sinusitis, recurrence not specified       Will tx with augmentin, mucinex, flonase, xyzal, sinus rinses, humidifier. F/u if worsening or no improvement   Relevant Medications   levocetirizine (XYZAL) 5 MG tablet   fluticasone (FLONASE) 50 MCG/ACT nasal spray  amoxicillin-clavulanate (AUGMENTIN) 875-125 MG tablet   Anxiety       Long discussion, pt wanting time to think about options. discussed buspar vs prozac +/- counseling. Packet of counselors and med info given       Follow up plan: Return for CPE.

## 2017-10-09 NOTE — Assessment & Plan Note (Signed)
Will start good allergy regimen with xyzal and flonase daily. Sinus rinses prn, humidifiers at night.

## 2017-11-05 ENCOUNTER — Encounter: Payer: Self-pay | Admitting: Family Medicine

## 2017-11-05 ENCOUNTER — Ambulatory Visit: Payer: 59 | Admitting: Family Medicine

## 2017-11-05 VITALS — BP 152/90 | HR 78 | Temp 98.2°F | Ht 73.0 in | Wt 204.1 lb

## 2017-11-05 DIAGNOSIS — J309 Allergic rhinitis, unspecified: Secondary | ICD-10-CM | POA: Diagnosis not present

## 2017-11-05 DIAGNOSIS — F419 Anxiety disorder, unspecified: Secondary | ICD-10-CM | POA: Diagnosis not present

## 2017-11-05 DIAGNOSIS — Z114 Encounter for screening for human immunodeficiency virus [HIV]: Secondary | ICD-10-CM

## 2017-11-05 DIAGNOSIS — Z0000 Encounter for general adult medical examination without abnormal findings: Secondary | ICD-10-CM

## 2017-11-05 DIAGNOSIS — Z0001 Encounter for general adult medical examination with abnormal findings: Secondary | ICD-10-CM | POA: Diagnosis not present

## 2017-11-05 LAB — UA/M W/RFLX CULTURE, ROUTINE
BILIRUBIN UA: NEGATIVE
Glucose, UA: NEGATIVE
Ketones, UA: NEGATIVE
Leukocytes, UA: NEGATIVE
Nitrite, UA: NEGATIVE
PH UA: 7 (ref 5.0–7.5)
Specific Gravity, UA: 1.02 (ref 1.005–1.030)
UUROB: 0.2 mg/dL (ref 0.2–1.0)

## 2017-11-05 LAB — MICROSCOPIC EXAMINATION: BACTERIA UA: NONE SEEN

## 2017-11-05 MED ORDER — FLUOXETINE HCL 20 MG PO TABS: 20.0000 mg | ORAL_TABLET | Freq: Every day | ORAL | 1 refills | Status: DC

## 2017-11-05 NOTE — Progress Notes (Addendum)
BP (!) 152/90 (BP Location: Left Arm, Patient Position: Sitting, Cuff Size: Normal)   Pulse 78   Temp 98.2 F (36.8 C) (Tympanic)   Ht 6\' 1"  (1.854 m)   Wt 204 lb 1.6 oz (92.6 kg)   SpO2 98%   BMI 26.93 kg/m    Subjective:    Patient ID: Ricky Anderson., male    DOB: Feb 07, 1981, 37 y.o.   MRN: [id number]  HPI: Isaih Anderson. is a 37 y.o. male presenting on 11/05/2017 for comprehensive medical examination. Current medical complaints include:see below  Still having daily issues with anxiety, has considered the options given at last appt (medications vs counseling) and interested in trying prozac. Not ready to try counseling at this time. Denies SI/HI, paranoia, sleep or appetite issues.   Improving allergy sxs but still significant nasal inflammation and congestion, post nasal drainage. Taking xyzal and flonase daily currently. Denies cough, SOB, facial pain or pressure, HAs, fevers, chills.   Depression Screen done today and results listed below:  No flowsheet data found. Actual form lost - score today was a 7.   The patient does not have a history of falls. I did not complete a risk assessment for falls. A plan of care for falls was not documented.  Past Medical History:  Past Medical History:  Diagnosis Date  . Seasonal allergies     Surgical History:  No past surgical history on file.  Medications:  Current Outpatient Medications on File Prior to Visit  Medication Sig  . fluticasone (FLONASE) 50 MCG/ACT nasal spray Place 2 sprays into both nostrils 2 (two) times daily.  Marland Kitchen levocetirizine (XYZAL) 5 MG tablet Take 1 tablet (5 mg total) by mouth daily.   No current facility-administered medications on file prior to visit.     Allergies:  No Known Allergies  Social History:  Social History   Socioeconomic History  . Marital status: Single    Spouse name: Not on file  . Number of children: Not on file  . Years of education: Not on file  . Highest education  level: Not on file  Social Needs  . Financial resource strain: Not on file  . Food insecurity - worry: Not on file  . Food insecurity - inability: Not on file  . Transportation needs - medical: Not on file  . Transportation needs - non-medical: Not on file  Occupational History  . Not on file  Tobacco Use  . Smoking status: Current Every Day Smoker    Packs/day: 0.50    Types: Cigarettes  . Smokeless tobacco: Never Used  Substance and Sexual Activity  . Alcohol use: Yes    Alcohol/week: 1.2 oz    Types: 2 Cans of beer per week    Comment: per day, liquor on the weekends  . Drug use: No  . Sexual activity: Not on file  Other Topics Concern  . Not on file  Social History Narrative  . Not on file   Social History   Tobacco Use  Smoking Status Current Every Day Smoker  . Packs/day: 0.50  . Types: Cigarettes  Smokeless Tobacco Never Used   Social History   Substance and Sexual Activity  Alcohol Use Yes  . Alcohol/week: 1.2 oz  . Types: 2 Cans of beer per week   Comment: per day, liquor on the weekends    Family History:  Family History  Problem Relation Age of Onset  . Cancer Mother     Past medical  history, surgical history, medications, allergies, family history and social history reviewed with patient today and changes made to appropriate areas of the chart.   Review of Systems - General ROS: negative Psychological ROS: positive for - anxiety Ophthalmic ROS: negative ENT ROS: positive for - nasal discharge Allergy and Immunology ROS: positive for - nasal congestion, postnasal drip and seasonal allergies Respiratory ROS: no cough, shortness of breath, or wheezing Cardiovascular ROS: no chest pain or dyspnea on exertion Gastrointestinal ROS: no abdominal pain, change in bowel habits, or black or bloody stools Genito-Urinary ROS: no dysuria, trouble voiding, or hematuria Musculoskeletal ROS: negative Neurological ROS: no TIA or stroke symptoms Dermatological  ROS: negative All other ROS negative except what is listed above and in the HPI.      Objective:    BP (!) 152/90 (BP Location: Left Arm, Patient Position: Sitting, Cuff Size: Normal)   Pulse 78   Temp 98.2 F (36.8 C) (Tympanic)   Ht 6\' 1"  (1.854 m)   Wt 204 lb 1.6 oz (92.6 kg)   SpO2 98%   BMI 26.93 kg/m   Wt Readings from Last 3 Encounters:  11/05/17 204 lb 1.6 oz (92.6 kg)  10/08/17 201 lb 9.6 oz (91.4 kg)  09/18/17 190 lb (86.2 kg)    Physical Exam  Constitutional: He is oriented to person, place, and time. He appears well-developed and well-nourished. No distress.  HENT:  Head: Atraumatic.  Right Ear: External ear normal.  Left Ear: External ear normal.  Mouth/Throat: Oropharynx is clear and moist.  Nasal mucosa significantly boggy and erythematous, rhinorrhea present  Eyes: Conjunctivae are normal. Pupils are equal, round, and reactive to light. No scleral icterus.  Neck: Normal range of motion. Neck supple.  Cardiovascular: Normal rate, regular rhythm, normal heart sounds and intact distal pulses.  No murmur heard. Pulmonary/Chest: Effort normal and breath sounds normal. No respiratory distress.  Abdominal: Soft. Bowel sounds are normal. He exhibits no distension and no mass. There is no tenderness. There is no guarding.  Musculoskeletal: Normal range of motion. He exhibits no edema or tenderness.  Neurological: He is alert and oriented to person, place, and time. He has normal reflexes.  Skin: Skin is warm and dry. No rash noted.  Psychiatric: He has a normal mood and affect. His behavior is normal.  Nursing note and vitals reviewed.   Results for orders placed or performed in visit on 11/05/17  Microscopic Examination  Result Value Ref Range   WBC, UA 0-5 0 - 5 /hpf   RBC, UA 0-2 0 - 2 /hpf   Epithelial Cells (non renal) CANCELED    Bacteria, UA None seen None seen/Few  HIV antibody (with reflex)  Result Value Ref Range   HIV Screen 4th Generation wRfx Non  Reactive Non Reactive  CBC with Differential/Platelet  Result Value Ref Range   WBC 8.3 3.4 - 10.8 x10E3/uL   RBC 5.65 4.14 - 5.80 x10E6/uL   Hemoglobin 13.6 13.0 - 17.7 g/dL   Hematocrit 16.1 09.6 - 51.0 %   MCV 77 (L) 79 - 97 fL   MCH 24.1 (L) 26.6 - 33.0 pg   MCHC 31.3 (L) 31.5 - 35.7 g/dL   RDW 04.5 (H) 40.9 - 81.1 %   Platelets 262 150 - 379 x10E3/uL   Neutrophils 48 Not Estab. %   Lymphs 33 Not Estab. %   Monocytes 12 Not Estab. %   Eos 6 Not Estab. %   Basos 1 Not Estab. %  Neutrophils Absolute 4.0 1.4 - 7.0 x10E3/uL   Lymphocytes Absolute 2.8 0.7 - 3.1 x10E3/uL   Monocytes Absolute 1.0 (H) 0.1 - 0.9 x10E3/uL   EOS (ABSOLUTE) 0.5 (H) 0.0 - 0.4 x10E3/uL   Basophils Absolute 0.1 0.0 - 0.2 x10E3/uL   Immature Granulocytes 0 Not Estab. %   Immature Grans (Abs) 0.0 0.0 - 0.1 x10E3/uL  Comprehensive metabolic panel  Result Value Ref Range   Glucose 81 65 - 99 mg/dL   BUN 9 6 - 20 mg/dL   Creatinine, Ser 1.61 0.76 - 1.27 mg/dL   GFR calc non Af Amer 113 >59 mL/min/1.73   GFR calc Af Amer 131 >59 mL/min/1.73   BUN/Creatinine Ratio 11 9 - 20   Sodium 143 134 - 144 mmol/L   Potassium 4.2 3.5 - 5.2 mmol/L   Chloride 100 96 - 106 mmol/L   CO2 22 20 - 29 mmol/L   Calcium 9.6 8.7 - 10.2 mg/dL   Total Protein 7.7 6.0 - 8.5 g/dL   Albumin 4.6 3.5 - 5.5 g/dL   Globulin, Total 3.1 1.5 - 4.5 g/dL   Albumin/Globulin Ratio 1.5 1.2 - 2.2   Bilirubin Total 0.2 0.0 - 1.2 mg/dL   Alkaline Phosphatase 82 39 - 117 IU/L   AST 35 0 - 40 IU/L   ALT 29 0 - 44 IU/L  Lipid Panel w/o Chol/HDL Ratio  Result Value Ref Range   Cholesterol, Total 277 (H) 100 - 199 mg/dL   Triglycerides 78 0 - 149 mg/dL   HDL 55 >09 mg/dL   VLDL Cholesterol Cal 16 5 - 40 mg/dL   LDL Calculated 604 (H) 0 - 99 mg/dL   Comment: Comment   TSH  Result Value Ref Range   TSH 1.730 0.450 - 4.500 uIU/mL  UA/M w/rflx Culture, Routine  Result Value Ref Range   Specific Gravity, UA 1.020 1.005 - 1.030   pH, UA 7.0  5.0 - 7.5   Color, UA Yellow Yellow   Appearance Ur Clear Clear   Leukocytes, UA Negative Negative   Protein, UA Trace (A) Negative/Trace   Glucose, UA Negative Negative   Ketones, UA Negative Negative   RBC, UA Trace (A) Negative   Bilirubin, UA Negative Negative   Urobilinogen, Ur 0.2 0.2 - 1.0 mg/dL   Nitrite, UA Negative Negative   Microscopic Examination See below:       Assessment & Plan:   Problem List Items Addressed This Visit      Respiratory   Allergic rhinitis    Will increase xyzal to twice daily in addition to BID flonase. Sinus rinses, humidifier. If still no improvement in control, will add singulair for better coverage        Other   Anxiety - Primary    Will start prozac and monitor closely for benefit. Risks and benefits reviewed with pt, as well as expectations for timeframe of improvement. Continue to encourage counseling. F/u in 4-6 weeks      Relevant Medications   FLUoxetine (PROZAC) 20 MG tablet    Other Visit Diagnoses    Annual physical exam       Relevant Orders   CBC with Differential/Platelet (Completed)   Comprehensive metabolic panel (Completed)   Lipid Panel w/o Chol/HDL Ratio (Completed)   TSH (Completed)   UA/M w/rflx Culture, Routine (Completed)   Microscopic Examination (Completed)   Encounter for screening for HIV       Relevant Orders   HIV antibody (with reflex) (Completed)  Discussed aspirin prophylaxis for myocardial infarction prevention and decision was it was not indicated  LABORATORY TESTING:  Health maintenance labs ordered today as discussed above.   IMMUNIZATIONS:   - Tdap: Tetanus vaccination status reviewed: last tetanus booster within 10 years. - Influenza: Refused  PATIENT COUNSELING:    Sexuality: Discussed sexually transmitted diseases, partner selection, use of condoms, avoidance of unintended pregnancy  and contraceptive alternatives.   Advised to avoid cigarette smoking.  I discussed with the  patient that most people either abstain from alcohol or drink within safe limits (<=14/week and <=4 drinks/occasion for males, <=7/weeks and <= 3 drinks/occasion for females) and that the risk for alcohol disorders and other health effects rises proportionally with the number of drinks per week and how often a drinker exceeds daily limits.  Discussed cessation/primary prevention of drug use and availability of treatment for abuse.   Diet: Encouraged to adjust caloric intake to maintain  or achieve ideal body weight, to reduce intake of dietary saturated fat and total fat, to limit sodium intake by avoiding high sodium foods and not adding table salt, and to maintain adequate dietary potassium and calcium preferably from fresh fruits, vegetables, and low-fat dairy products.    stressed the importance of regular exercise  Injury prevention: Discussed safety belts, safety helmets, smoke detector, smoking near bedding or upholstery.   Dental health: Discussed importance of regular tooth brushing, flossing, and dental visits.   Follow up plan: NEXT PREVENTATIVE PHYSICAL DUE IN 1 YEAR. Return in about 4 weeks (around 12/03/2017) for Anxiety f/u.

## 2017-11-06 LAB — COMPREHENSIVE METABOLIC PANEL
A/G RATIO: 1.5 (ref 1.2–2.2)
ALT: 29 IU/L (ref 0–44)
AST: 35 IU/L (ref 0–40)
Albumin: 4.6 g/dL (ref 3.5–5.5)
Alkaline Phosphatase: 82 IU/L (ref 39–117)
BILIRUBIN TOTAL: 0.2 mg/dL (ref 0.0–1.2)
BUN/Creatinine Ratio: 11 (ref 9–20)
BUN: 9 mg/dL (ref 6–20)
CHLORIDE: 100 mmol/L (ref 96–106)
CO2: 22 mmol/L (ref 20–29)
Calcium: 9.6 mg/dL (ref 8.7–10.2)
Creatinine, Ser: 0.82 mg/dL (ref 0.76–1.27)
GFR calc non Af Amer: 113 mL/min/{1.73_m2} (ref >59)
GFR, EST AFRICAN AMERICAN: 131 mL/min/{1.73_m2} (ref >59)
GLOBULIN, TOTAL: 3.1 g/dL (ref 1.5–4.5)
Glucose: 81 mg/dL (ref 65–99)
Potassium: 4.2 mmol/L (ref 3.5–5.2)
SODIUM: 143 mmol/L (ref 134–144)
TOTAL PROTEIN: 7.7 g/dL (ref 6.0–8.5)

## 2017-11-06 LAB — HIV ANTIBODY (ROUTINE TESTING W REFLEX): HIV Screen 4th Generation wRfx: NONREACTIVE

## 2017-11-06 LAB — CBC WITH DIFFERENTIAL/PLATELET
BASOS: 1 %
Basophils Absolute: 0.1 10*3/uL (ref 0.0–0.2)
EOS (ABSOLUTE): 0.5 10*3/uL — AB (ref 0.0–0.4)
Eos: 6 %
Hematocrit: 43.4 % (ref 37.5–51.0)
Hemoglobin: 13.6 g/dL (ref 13.0–17.7)
Immature Grans (Abs): 0 10*3/uL (ref 0.0–0.1)
Immature Granulocytes: 0 %
Lymphocytes Absolute: 2.8 10*3/uL (ref 0.7–3.1)
Lymphs: 33 %
MCH: 24.1 pg — AB (ref 26.6–33.0)
MCHC: 31.3 g/dL — ABNORMAL LOW (ref 31.5–35.7)
MCV: 77 fL — AB (ref 79–97)
MONOS ABS: 1 10*3/uL — AB (ref 0.1–0.9)
Monocytes: 12 %
NEUTROS ABS: 4 10*3/uL (ref 1.4–7.0)
Neutrophils: 48 %
PLATELETS: 262 10*3/uL (ref 150–379)
RBC: 5.65 x10E6/uL (ref 4.14–5.80)
RDW: 17.2 % — AB (ref 12.3–15.4)
WBC: 8.3 10*3/uL (ref 3.4–10.8)

## 2017-11-06 LAB — LIPID PANEL W/O CHOL/HDL RATIO
Cholesterol, Total: 277 mg/dL — ABNORMAL HIGH (ref 100–199)
HDL: 55 mg/dL (ref >39)
LDL Calculated: 206 mg/dL — ABNORMAL HIGH (ref 0–99)
Triglycerides: 78 mg/dL (ref 0–149)
VLDL Cholesterol Cal: 16 mg/dL (ref 5–40)

## 2017-11-06 LAB — TSH: TSH: 1.73 u[IU]/mL (ref 0.450–4.500)

## 2017-11-07 DIAGNOSIS — F419 Anxiety disorder, unspecified: Secondary | ICD-10-CM | POA: Insufficient documentation

## 2017-11-07 NOTE — Assessment & Plan Note (Signed)
Will start prozac and monitor closely for benefit. Risks and benefits reviewed with pt, as well as expectations for timeframe of improvement. Continue to encourage counseling. F/u in 4-6 weeks

## 2017-11-07 NOTE — Assessment & Plan Note (Signed)
Will increase xyzal to twice daily in addition to BID flonase. Sinus rinses, humidifier. If still no improvement in control, will add singulair for better coverage

## 2017-11-07 NOTE — Patient Instructions (Signed)
Follow-up in 4 weeks

## 2017-11-08 ENCOUNTER — Telehealth: Payer: Self-pay | Admitting: Family Medicine

## 2017-11-08 MED ORDER — ATORVASTATIN CALCIUM 40 MG PO TABS: 40.0000 mg | ORAL_TABLET | Freq: Every day | ORAL | 1 refills | Status: DC

## 2017-11-08 NOTE — Telephone Encounter (Signed)
Called pt regarding lab results, left VM and he returned call. Normal CPE labs other than significantly elevated cholesterol suggesting familial component. Pt unaware of any family hx or personal hx of elevated cholesterol. Agreeable to recommended statin initiation. Atorvastatin sent to pharmacy. Will recheck things in about 6 months to assess dose efficacy.

## 2017-11-29 ENCOUNTER — Other Ambulatory Visit: Payer: Self-pay | Admitting: Family Medicine

## 2017-11-29 MED ORDER — LEVOCETIRIZINE DIHYDROCHLORIDE 5 MG PO TABS: 5.0000 mg | ORAL_TABLET | Freq: Two times a day (BID) | ORAL | 11 refills | Status: DC

## 2017-11-29 NOTE — Telephone Encounter (Signed)
Patient was instructed to take differently than original Rx: 2 tablets daily- please review visit 3/18 for Rx sig correction and # correction.  LOV: 11/05/17  PCP: Maurice MarchLane  Pharmacy: verified

## 2017-11-29 NOTE — Telephone Encounter (Signed)
Copied from CRM [id number]. Topic: Quick Communication - Rx Refill/Question >> Nov 29, 2017  8:16 AM Leafy Roobinson, Norma J wrote: Medication: generic xyzal Has the patient contacted their pharmacy yes.  [facility] [address] [phone]. Pt needs early refill

## 2017-11-29 NOTE — Telephone Encounter (Signed)
Rx changed and sent in.  

## 2017-12-03 ENCOUNTER — Ambulatory Visit: Payer: 59 | Admitting: Family Medicine

## 2017-12-03 ENCOUNTER — Encounter: Payer: Self-pay | Admitting: Family Medicine

## 2017-12-03 VITALS — BP 160/94 | HR 113 | Temp 98.9°F | Wt 201.6 lb

## 2017-12-03 DIAGNOSIS — F419 Anxiety disorder, unspecified: Secondary | ICD-10-CM | POA: Diagnosis not present

## 2017-12-03 DIAGNOSIS — E785 Hyperlipidemia, unspecified: Secondary | ICD-10-CM | POA: Diagnosis not present

## 2017-12-03 DIAGNOSIS — K219 Gastro-esophageal reflux disease without esophagitis: Secondary | ICD-10-CM | POA: Diagnosis not present

## 2017-12-03 DIAGNOSIS — R030 Elevated blood-pressure reading, without diagnosis of hypertension: Secondary | ICD-10-CM

## 2017-12-03 DIAGNOSIS — J309 Allergic rhinitis, unspecified: Secondary | ICD-10-CM

## 2017-12-03 MED ORDER — TRIAMCINOLONE ACETONIDE 0.1 % EX CREA
1.0000 | TOPICAL_CREAM | Freq: Two times a day (BID) | CUTANEOUS | 0 refills | Status: DC
Start: 2017-12-03 — End: 2020-03-10

## 2017-12-03 MED ORDER — FLUOXETINE HCL 20 MG PO TABS: 20.0000 mg | ORAL_TABLET | Freq: Every day | ORAL | 1 refills | Status: DC

## 2017-12-03 MED ORDER — PANTOPRAZOLE SODIUM 40 MG PO TBEC: 40.0000 mg | DELAYED_RELEASE_TABLET | Freq: Every day | ORAL | 3 refills | Status: DC

## 2017-12-03 NOTE — Progress Notes (Addendum)
BP (!) 160/94   Pulse (!) 113   Temp 98.9 F (37.2 C) (Oral)   Wt 201 lb 9.6 oz (91.4 kg)   SpO2 97%   BMI 26.60 kg/m    Subjective:    Patient ID: Ricky Nordmann., male    DOB: 19-Jun-1981, 37 y.o.   MRN: [id number]  HPI: Ricky Anderson. is a 37 y.o. male  Chief Complaint  Patient presents with  . Anxiety    Patient states he is better.  . Follow-up   Pt here today for anxiety fu after starting prozac. Significant improvement on medication, no reported side effects. Feels current dose is adequate, not wanting to make any changes. Notes he's much more relaxed, less agitated. Denies SI/HI, paranoia, severe mood swings.   GAD 7 : Generalized Anxiety Score 12/03/2017  Nervous, Anxious, on Edge 0  Control/stop worrying 0  Worry too much - different things 0  Trouble relaxing 0  Restless 0  Easily annoyed or irritable 0  Afraid - awful might happen 0  Total GAD 7 Score 0   Depression screen PHQ 2/9 12/03/2017  Decreased Interest 0  Down, Depressed, Hopeless 0  PHQ - 2 Score 0  Altered sleeping 0  Tired, decreased energy 0  Change in appetite 0  Feeling bad or failure about yourself  0  Trouble concentrating 0  Moving slowly or fidgety/restless 0  Suicidal thoughts 0  PHQ-9 Score 0    Allergy sxs improved since increasing regimen to xyzal and flonase BID, but still having some breakthrough sxs daily with rhinorrhea, swollen nostrils, eye itching and drainage.   Having worsening reflux sxs, previously was able to control with prn ranitidine but that is no longer working well for him. Has reduced spicy foods and caffeine with no improvement. Denies hematemesis, abdominal pain, N/V, fevers, dysphagia. Has never tried other medicines for his reflux.   Past Medical History:  Diagnosis Date  . Seasonal allergies    Social History   Socioeconomic History  . Marital status: Single    Spouse name: Not on file  . Number of children: Not on file  . Years of  education: Not on file  . Highest education level: Not on file  Occupational History  . Not on file  Social Needs  . Financial resource strain: Not on file  . Food insecurity:    Worry: Not on file    Inability: Not on file  . Transportation needs:    Medical: Not on file    Non-medical: Not on file  Tobacco Use  . Smoking status: Current Every Day Smoker    Packs/day: 0.50    Types: Cigarettes  . Smokeless tobacco: Never Used  Substance and Sexual Activity  . Alcohol use: Yes    Alcohol/week: 1.2 oz    Types: 2 Cans of beer per week    Comment: per day, liquor on the weekends  . Drug use: No  . Sexual activity: Not on file  Lifestyle  . Physical activity:    Days per week: Not on file    Minutes per session: Not on file  . Stress: Not on file  Relationships  . Social connections:    Talks on phone: Not on file    Gets together: Not on file    Attends religious service: Not on file    Active member of club or organization: Not on file    Attends meetings of clubs or organizations: Not on file  Relationship status: Not on file  . Intimate partner violence:    Fear of current or ex partner: Not on file    Emotionally abused: Not on file    Physically abused: Not on file    Forced sexual activity: Not on file  Other Topics Concern  . Not on file  Social History Narrative  . Not on file   Relevant past medical, surgical, family and social history reviewed and updated as indicated. Interim medical history since our last visit reviewed. Allergies and medications reviewed and updated.  Review of Systems  Per HPI unless specifically indicated above     Objective:    BP (!) 160/94   Pulse (!) 113   Temp 98.9 F (37.2 C) (Oral)   Wt 201 lb 9.6 oz (91.4 kg)   SpO2 97%   BMI 26.60 kg/m   Wt Readings from Last 3 Encounters:  12/03/17 201 lb 9.6 oz (91.4 kg)  11/05/17 204 lb 1.6 oz (92.6 kg)  10/08/17 201 lb 9.6 oz (91.4 kg)    Physical Exam  Constitutional:  He is oriented to person, place, and time. He appears well-developed and well-nourished. No distress.  HENT:  Head: Atraumatic.  Right Ear: External ear normal.  Left Ear: External ear normal.  Nasal mucosa erythematous and boggy Oropharynx injected posteriorly  Eyes: Pupils are equal, round, and reactive to light. Conjunctivae are normal.  Neck: Normal range of motion. Neck supple.  Cardiovascular: Normal rate, regular rhythm and normal heart sounds.  Pulmonary/Chest: Effort normal and breath sounds normal.  Abdominal: Soft. Bowel sounds are normal. He exhibits no distension. There is no tenderness.  Musculoskeletal: Normal range of motion.  Lymphadenopathy:    He has no cervical adenopathy.  Neurological: He is alert and oriented to person, place, and time.  Skin: Skin is warm and dry.  Psychiatric: He has a normal mood and affect. His behavior is normal. Judgment and thought content normal.  Nursing note and vitals reviewed.   Results for orders placed or performed in visit on 11/05/17  Microscopic Examination  Result Value Ref Range   WBC, UA 0-5 0 - 5 /hpf   RBC, UA 0-2 0 - 2 /hpf   Epithelial Cells (non renal) CANCELED    Bacteria, UA None seen None seen/Few  HIV antibody (with reflex)  Result Value Ref Range   HIV Screen 4th Generation wRfx Non Reactive Non Reactive  CBC with Differential/Platelet  Result Value Ref Range   WBC 8.3 3.4 - 10.8 x10E3/uL   RBC 5.65 4.14 - 5.80 x10E6/uL   Hemoglobin 13.6 13.0 - 17.7 g/dL   Hematocrit 40.9 81.1 - 51.0 %   MCV 77 (L) 79 - 97 fL   MCH 24.1 (L) 26.6 - 33.0 pg   MCHC 31.3 (L) 31.5 - 35.7 g/dL   RDW 91.4 (H) 78.2 - 95.6 %   Platelets 262 150 - 379 x10E3/uL   Neutrophils 48 Not Estab. %   Lymphs 33 Not Estab. %   Monocytes 12 Not Estab. %   Eos 6 Not Estab. %   Basos 1 Not Estab. %   Neutrophils Absolute 4.0 1.4 - 7.0 x10E3/uL   Lymphocytes Absolute 2.8 0.7 - 3.1 x10E3/uL   Monocytes Absolute 1.0 (H) 0.1 - 0.9 x10E3/uL    EOS (ABSOLUTE) 0.5 (H) 0.0 - 0.4 x10E3/uL   Basophils Absolute 0.1 0.0 - 0.2 x10E3/uL   Immature Granulocytes 0 Not Estab. %   Immature Grans (Abs) 0.0 0.0 -  0.1 x10E3/uL  Comprehensive metabolic panel  Result Value Ref Range   Glucose 81 65 - 99 mg/dL   BUN 9 6 - 20 mg/dL   Creatinine, Ser 1.610.82 0.76 - 1.27 mg/dL   GFR calc non Af Amer 113 >59 mL/min/1.73   GFR calc Af Amer 131 >59 mL/min/1.73   BUN/Creatinine Ratio 11 9 - 20   Sodium 143 134 - 144 mmol/L   Potassium 4.2 3.5 - 5.2 mmol/L   Chloride 100 96 - 106 mmol/L   CO2 22 20 - 29 mmol/L   Calcium 9.6 8.7 - 10.2 mg/dL   Total Protein 7.7 6.0 - 8.5 g/dL   Albumin 4.6 3.5 - 5.5 g/dL   Globulin, Total 3.1 1.5 - 4.5 g/dL   Albumin/Globulin Ratio 1.5 1.2 - 2.2   Bilirubin Total 0.2 0.0 - 1.2 mg/dL   Alkaline Phosphatase 82 39 - 117 IU/L   AST 35 0 - 40 IU/L   ALT 29 0 - 44 IU/L  Lipid Panel w/o Chol/HDL Ratio  Result Value Ref Range   Cholesterol, Total 277 (H) 100 - 199 mg/dL   Triglycerides 78 0 - 149 mg/dL   HDL 55 >09>39 mg/dL   VLDL Cholesterol Cal 16 5 - 40 mg/dL   LDL Calculated 604206 (H) 0 - 99 mg/dL   Comment: Comment   TSH  Result Value Ref Range   TSH 1.730 0.450 - 4.500 uIU/mL  UA/M w/rflx Culture, Routine  Result Value Ref Range   Specific Gravity, UA 1.020 1.005 - 1.030   pH, UA 7.0 5.0 - 7.5   Color, UA Yellow Yellow   Appearance Ur Clear Clear   Leukocytes, UA Negative Negative   Protein, UA Trace (A) Negative/Trace   Glucose, UA Negative Negative   Ketones, UA Negative Negative   RBC, UA Trace (A) Negative   Bilirubin, UA Negative Negative   Urobilinogen, Ur 0.2 0.2 - 1.0 mg/dL   Nitrite, UA Negative Negative   Microscopic Examination See below:       Assessment & Plan:   Problem List Items Addressed This Visit      Respiratory   Allergic rhinitis    Continue increased regimen with BID xyzal and flonase, start humidifier, sinus rinses, nasal saline. If needed, will add singulair but pt not ready  yet        Digestive   GERD (gastroesophageal reflux disease)    Start protonix with prn ranitidine. Diet and lifestyle modifications reviewed      Relevant Medications   pantoprazole (PROTONIX) 40 MG tablet     Other   Anxiety - Primary    Significant improvement on prozac, continue current regimen      Relevant Medications   FLUoxetine (PROZAC) 20 MG tablet   Hyperlipidemia    Started on lipitor after previous OV, tolerating well so far. Will recheck levels at next f/u       Other Visit Diagnoses    Elevated blood pressure reading       Monitoring closely, has been high several times now. Pt wishing to work on lifestyle modifications for now rather than start medications. Recheck at next OV       Follow up plan: Return in about 6 months (around 06/04/2018) for Chol, BP, Anxiety f/u.

## 2017-12-06 DIAGNOSIS — K219 Gastro-esophageal reflux disease without esophagitis: Secondary | ICD-10-CM | POA: Insufficient documentation

## 2017-12-06 NOTE — Assessment & Plan Note (Signed)
Started on lipitor after previous OV, tolerating well so far. Will recheck levels at next f/u

## 2017-12-06 NOTE — Assessment & Plan Note (Signed)
Significant improvement on prozac, continue current regimen

## 2017-12-06 NOTE — Assessment & Plan Note (Signed)
Continue increased regimen with BID xyzal and flonase, start humidifier, sinus rinses, nasal saline. If needed, will add singulair but pt not ready yet

## 2017-12-06 NOTE — Patient Instructions (Signed)
Follow up in 6 months 

## 2017-12-06 NOTE — Assessment & Plan Note (Signed)
Start protonix with prn ranitidine. Diet and lifestyle modifications reviewed

## 2018-05-09 ENCOUNTER — Other Ambulatory Visit: Payer: Self-pay | Admitting: Family Medicine

## 2018-05-10 NOTE — Telephone Encounter (Signed)
Protinx 40mg  refill Last Refill:12/03/17 # 30 Last OV: 12/03/17 [facility] [phone]

## 2018-06-04 ENCOUNTER — Encounter: Payer: Self-pay | Admitting: Family Medicine

## 2018-06-04 ENCOUNTER — Ambulatory Visit (INDEPENDENT_AMBULATORY_CARE_PROVIDER_SITE_OTHER): Payer: 59 | Admitting: Family Medicine

## 2018-06-04 VITALS — BP 137/84 | HR 88 | Temp 98.4°F | Ht 73.0 in | Wt 205.7 lb

## 2018-06-04 DIAGNOSIS — F419 Anxiety disorder, unspecified: Secondary | ICD-10-CM

## 2018-06-04 DIAGNOSIS — K219 Gastro-esophageal reflux disease without esophagitis: Secondary | ICD-10-CM | POA: Diagnosis not present

## 2018-06-04 DIAGNOSIS — E785 Hyperlipidemia, unspecified: Secondary | ICD-10-CM | POA: Diagnosis not present

## 2018-06-04 DIAGNOSIS — J309 Allergic rhinitis, unspecified: Secondary | ICD-10-CM | POA: Diagnosis not present

## 2018-06-04 MED ORDER — MONTELUKAST SODIUM 10 MG PO TABS: 10.0000 mg | ORAL_TABLET | Freq: Every day | ORAL | 3 refills | Status: DC

## 2018-06-04 MED ORDER — PANTOPRAZOLE SODIUM 40 MG PO TBEC: 40.0000 mg | DELAYED_RELEASE_TABLET | Freq: Every day | ORAL | 6 refills | Status: DC

## 2018-06-04 MED ORDER — FLUOXETINE HCL 20 MG PO TABS: 20.0000 mg | ORAL_TABLET | Freq: Every day | ORAL | 1 refills | Status: DC

## 2018-06-04 NOTE — Assessment & Plan Note (Signed)
Stable, continue current regimen 

## 2018-06-04 NOTE — Patient Instructions (Signed)
Follow up for CPE in 6 months 

## 2018-06-04 NOTE — Assessment & Plan Note (Signed)
Stable on protonix, work on dietary modifications and continue current regimen

## 2018-06-04 NOTE — Assessment & Plan Note (Signed)
Will recheck cholesterol and adjust if needed. Dietary modifications reviewed

## 2018-06-04 NOTE — Progress Notes (Signed)
BP 137/84 (BP Location: Right Arm, Patient Position: Sitting, Cuff Size: Normal)   Pulse 88   Temp 98.4 F (36.9 C) (Oral)   Ht 6\' 1"  (1.854 m)   Wt 205 lb 11.2 oz (93.3 kg)   SpO2 98%   BMI 27.14 kg/m    Subjective:    Patient ID: Ricky Nordmann., male    DOB: [birth year], 37 y.o.   MRN: [id number]  HPI: Ricky Grow. is a 37 y.o. male  Chief Complaint  Patient presents with  . Follow-up    6 month F/U for HTN, Chol, Anxiety  . Nasal Congestion    Patient states he's still having problems with his sinuses and nasal congestion.   . Sinusitis   Here today for 6 month f/u.   Taking lipitor daily without side effects. Has not really changed diet since diagnosis of hyperlipidemia. Denies CP, SOB, claudication.   Allergies not under good control on xyzal and flonase BID. Still having significant congestion and sinus drainage/pressure. Sometimes causing headaches.   Protonix working well for his GERD. Has tried tapering back on it but becomes quite symptomatic when trying that. Wanting to stay on daily.   Anxiety doing much better since addition of prozac. Feeling much calmer overall. Denies SI/HI, appetite or sleep issues.   Depression screen [facility] 2/9 12/03/2017  Decreased Interest 0  Down, Depressed, Hopeless 0  PHQ - 2 Score 0  Altered sleeping 0  Tired, decreased energy 0  Change in appetite 0  Feeling bad or failure about yourself  0  Trouble concentrating 0  Moving slowly or fidgety/restless 0  Suicidal thoughts 0  PHQ-9 Score 0    Relevant past medical, surgical, family and social history reviewed and updated as indicated. Interim medical history since our last visit reviewed. Allergies and medications reviewed and updated.  Review of Systems  Per HPI unless specifically indicated above     Objective:    BP 137/84 (BP Location: Right Arm, Patient Position: Sitting, Cuff Size: Normal)   Pulse 88   Temp 98.4 F (36.9 C) (Oral)   Ht 6\' 1"  (1.854 m)    Wt 205 lb 11.2 oz (93.3 kg)   SpO2 98%   BMI 27.14 kg/m   Wt Readings from Last 3 Encounters:  06/04/18 205 lb 11.2 oz (93.3 kg)  12/03/17 201 lb 9.6 oz (91.4 kg)  11/05/17 204 lb 1.6 oz (92.6 kg)    Physical Exam  Constitutional: He is oriented to person, place, and time. He appears well-developed and well-nourished.  HENT:  Head: Atraumatic.  Right Ear: External ear normal.  Left Ear: External ear normal.  Oropharynx injected, nasal mucosa boggy and erythematous with rhinorrhea present  Eyes: Conjunctivae and EOM are normal.  Neck: Normal range of motion. Neck supple.  Cardiovascular: Normal rate, regular rhythm and normal heart sounds.  Pulmonary/Chest: Effort normal and breath sounds normal.  Musculoskeletal: Normal range of motion.  Lymphadenopathy:    He has no cervical adenopathy.  Neurological: He is alert and oriented to person, place, and time.  Skin: Skin is warm and dry.  Psychiatric: He has a normal mood and affect. His behavior is normal.  Nursing note and vitals reviewed.   Results for orders placed or performed in visit on 11/05/17  Microscopic Examination  Result Value Ref Range   WBC, UA 0-5 0 - 5 /hpf   RBC, UA 0-2 0 - 2 /hpf   Epithelial Cells (non renal) CANCELED  Bacteria, UA None seen None seen/Few  HIV antibody (with reflex)  Result Value Ref Range   HIV Screen 4th Generation wRfx Non Reactive Non Reactive  CBC with Differential/Platelet  Result Value Ref Range   WBC 8.3 3.4 - 10.8 x10E3/uL   RBC 5.65 4.14 - 5.80 x10E6/uL   Hemoglobin 13.6 13.0 - 17.7 g/dL   Hematocrit 96.0 45.4 - 51.0 %   MCV 77 (L) 79 - 97 fL   MCH 24.1 (L) 26.6 - 33.0 pg   MCHC 31.3 (L) 31.5 - 35.7 g/dL   RDW 09.8 (H) 11.9 - 14.7 %   Platelets 262 150 - 379 x10E3/uL   Neutrophils 48 Not Estab. %   Lymphs 33 Not Estab. %   Monocytes 12 Not Estab. %   Eos 6 Not Estab. %   Basos 1 Not Estab. %   Neutrophils Absolute 4.0 1.4 - 7.0 x10E3/uL   Lymphocytes Absolute 2.8  0.7 - 3.1 x10E3/uL   Monocytes Absolute 1.0 (H) 0.1 - 0.9 x10E3/uL   EOS (ABSOLUTE) 0.5 (H) 0.0 - 0.4 x10E3/uL   Basophils Absolute 0.1 0.0 - 0.2 x10E3/uL   Immature Granulocytes 0 Not Estab. %   Immature Grans (Abs) 0.0 0.0 - 0.1 x10E3/uL  Comprehensive metabolic panel  Result Value Ref Range   Glucose 81 65 - 99 mg/dL   BUN 9 6 - 20 mg/dL   Creatinine, Ser 8.29 0.76 - 1.27 mg/dL   GFR calc non Af Amer 113 >59 mL/min/1.73   GFR calc Af Amer 131 >59 mL/min/1.73   BUN/Creatinine Ratio 11 9 - 20   Sodium 143 134 - 144 mmol/L   Potassium 4.2 3.5 - 5.2 mmol/L   Chloride 100 96 - 106 mmol/L   CO2 22 20 - 29 mmol/L   Calcium 9.6 8.7 - 10.2 mg/dL   Total Protein 7.7 6.0 - 8.5 g/dL   Albumin 4.6 3.5 - 5.5 g/dL   Globulin, Total 3.1 1.5 - 4.5 g/dL   Albumin/Globulin Ratio 1.5 1.2 - 2.2   Bilirubin Total 0.2 0.0 - 1.2 mg/dL   Alkaline Phosphatase 82 39 - 117 IU/L   AST 35 0 - 40 IU/L   ALT 29 0 - 44 IU/L  Lipid Panel w/o Chol/HDL Ratio  Result Value Ref Range   Cholesterol, Total 277 (H) 100 - 199 mg/dL   Triglycerides 78 0 - 149 mg/dL   HDL 55 >56 mg/dL   VLDL Cholesterol Cal 16 5 - 40 mg/dL   LDL Calculated 213 (H) 0 - 99 mg/dL   Comment: Comment   TSH  Result Value Ref Range   TSH 1.730 0.450 - 4.500 uIU/mL  UA/M w/rflx Culture, Routine  Result Value Ref Range   Specific Gravity, UA 1.020 1.005 - 1.030   pH, UA 7.0 5.0 - 7.5   Color, UA Yellow Yellow   Appearance Ur Clear Clear   Leukocytes, UA Negative Negative   Protein, UA Trace (A) Negative/Trace   Glucose, UA Negative Negative   Ketones, UA Negative Negative   RBC, UA Trace (A) Negative   Bilirubin, UA Negative Negative   Urobilinogen, Ur 0.2 0.2 - 1.0 mg/dL   Nitrite, UA Negative Negative   Microscopic Examination See below:       Assessment & Plan:   Problem List Items Addressed This Visit      Respiratory   Allergic rhinitis - Primary    Not under good control, will add singular, sinus rinses, humidifier  and monitor  for improvement        Digestive   GERD (gastroesophageal reflux disease)    Stable on protonix, work on dietary modifications and continue current regimen      Relevant Medications   pantoprazole (PROTONIX) 40 MG tablet     Other   Anxiety    Stable, continue current regimen      Relevant Medications   FLUoxetine (PROZAC) 20 MG tablet   Hyperlipidemia    Will recheck cholesterol and adjust if needed. Dietary modifications reviewed      Relevant Orders   Lipid Panel w/o Chol/HDL Ratio   Comprehensive metabolic panel       Follow up plan: Return in about 6 months (around 12/04/2018) for CPE.

## 2018-06-04 NOTE — Assessment & Plan Note (Signed)
Not under good control, will add singular, sinus rinses, humidifier and monitor for improvement

## 2018-06-05 LAB — COMPREHENSIVE METABOLIC PANEL
ALBUMIN: 4.4 g/dL (ref 3.5–5.5)
ALK PHOS: 93 IU/L (ref 39–117)
ALT: 47 IU/L — ABNORMAL HIGH (ref 0–44)
AST: 45 IU/L — AB (ref 0–40)
Albumin/Globulin Ratio: 1.5 (ref 1.2–2.2)
BILIRUBIN TOTAL: 0.4 mg/dL (ref 0.0–1.2)
BUN / CREAT RATIO: 9 (ref 9–20)
BUN: 8 mg/dL (ref 6–20)
CHLORIDE: 101 mmol/L (ref 96–106)
CO2: 21 mmol/L (ref 20–29)
Calcium: 9.7 mg/dL (ref 8.7–10.2)
Creatinine, Ser: 0.93 mg/dL (ref 0.76–1.27)
GFR calc Af Amer: 121 mL/min/{1.73_m2} (ref >59)
GFR calc non Af Amer: 105 mL/min/{1.73_m2} (ref >59)
GLOBULIN, TOTAL: 3 g/dL (ref 1.5–4.5)
GLUCOSE: 99 mg/dL (ref 65–99)
Potassium: 4.2 mmol/L (ref 3.5–5.2)
SODIUM: 138 mmol/L (ref 134–144)
Total Protein: 7.4 g/dL (ref 6.0–8.5)

## 2018-06-05 LAB — LIPID PANEL W/O CHOL/HDL RATIO
Cholesterol, Total: 210 mg/dL — ABNORMAL HIGH (ref 100–199)
HDL: 46 mg/dL (ref >39)
LDL Calculated: 124 mg/dL — ABNORMAL HIGH (ref 0–99)
Triglycerides: 202 mg/dL — ABNORMAL HIGH (ref 0–149)
VLDL Cholesterol Cal: 40 mg/dL (ref 5–40)

## 2018-09-04 DIAGNOSIS — F17210 Nicotine dependence, cigarettes, uncomplicated: Secondary | ICD-10-CM | POA: Diagnosis not present

## 2018-09-04 DIAGNOSIS — J101 Influenza due to other identified influenza virus with other respiratory manifestations: Secondary | ICD-10-CM | POA: Diagnosis not present

## 2018-09-04 DIAGNOSIS — R51 Headache: Secondary | ICD-10-CM | POA: Diagnosis not present

## 2018-09-04 DIAGNOSIS — E785 Hyperlipidemia, unspecified: Secondary | ICD-10-CM | POA: Diagnosis not present

## 2018-09-04 DIAGNOSIS — R509 Fever, unspecified: Secondary | ICD-10-CM | POA: Diagnosis not present

## 2018-11-19 ENCOUNTER — Encounter: Payer: Self-pay | Admitting: Family Medicine

## 2018-11-21 ENCOUNTER — Encounter: Payer: Self-pay | Admitting: Family Medicine

## 2018-11-22 NOTE — Telephone Encounter (Signed)
Called pt, VM full. Will try again.

## 2018-12-04 ENCOUNTER — Other Ambulatory Visit: Payer: Self-pay

## 2018-12-04 ENCOUNTER — Encounter: Payer: Self-pay | Admitting: Family Medicine

## 2018-12-04 ENCOUNTER — Ambulatory Visit (INDEPENDENT_AMBULATORY_CARE_PROVIDER_SITE_OTHER): Payer: 59 | Admitting: Family Medicine

## 2018-12-04 DIAGNOSIS — J309 Allergic rhinitis, unspecified: Secondary | ICD-10-CM | POA: Diagnosis not present

## 2018-12-04 DIAGNOSIS — K219 Gastro-esophageal reflux disease without esophagitis: Secondary | ICD-10-CM

## 2018-12-04 DIAGNOSIS — F419 Anxiety disorder, unspecified: Secondary | ICD-10-CM | POA: Diagnosis not present

## 2018-12-04 DIAGNOSIS — E785 Hyperlipidemia, unspecified: Secondary | ICD-10-CM

## 2018-12-04 NOTE — Progress Notes (Signed)
There were no vitals taken for this visit.   Subjective:    Patient ID: Ricky NordmannJoseph Knights Jr., male    DOB: [birth year], 38 y.o.   MRN: [id number]  HPI: Ricky NordmannJoseph Schweiger Jr. is a 38 y.o. male  Chief Complaint  Patient presents with  . Hyperlipidemia  . Allergic Rhinitis   . Anxiety    . This visit was completed via WebEx due to the restrictions of the COVID-19 pandemic. All issues as above were discussed and addressed. Physical exam was done as above through visual confirmation on WebEx. If it was felt that the patient should be evaluated in the office, they were directed there. The patient verbally consented to this visit. . Location of the patient: work . Location of the provider: home . Those involved with this call:  . Provider: Roosvelt Maserachel Kmya Placide, PA-C . CMA: Wilhemena DurieBrittany Russell, CMA . Front Desk/Registration: Harriet PhoJoliza Johnson  . Time spent on call: 25 minutes with patient face to face via video conference. More than 50% of this time was spent in counseling and coordination of care. 5 minutes total spent in review of patient's record and preparation of their chart.  Patient presenting today for 6 month f/u.   States his allergies are still under very poor control despite faithful daily use of singulair, xyzal, and flonase BID. Having daily eye itching and discharge, scratchy throat, and significant congestion and sinus pressure. Seems to have issues almost all year long. Wanting to try something for further control. Denies fevers, chills, sinus pain, ear pain.   Tolerating lipitor well for HLD. No myalgias, claudication, CP, SOB. Trying to eat well, stays active but does not formally exercise.   Feels his anxiety is under excellent control with the prozac, and not having any side effects at current dose. Denies SI/HI.   [facility]04/2018 12/03/2017  Decreased Interest 0 0  Down, Depressed, Hopeless 0 0  PHQ - 2 Score 0 0  Altered sleeping 0 0  Tired, decreased energy 0 0   Change in appetite 0 0  Feeling bad or failure about yourself  0 0  Trouble concentrating 0 0  Moving slowly or fidgety/restless 0 0  Suicidal thoughts 0 0  PHQ-9 Score 0 0  Difficult doing work/chores Not difficult at all -   GAD 7 : Generalized Anxiety Score 12/04/2018 12/03/2017  Nervous, Anxious, on Edge 0 0  Control/stop worrying 0 0  Worry too much - different things 0 0  Trouble relaxing 0 0  Restless 0 0  Easily annoyed or irritable 0 0  Afraid - awful might happen 0 0  Total GAD 7 Score 0 0  Anxiety Difficulty Not difficult at all -   Relevant past medical, surgical, family and social history reviewed and updated as indicated. Interim medical history since our last visit reviewed. Allergies and medications reviewed and updated.  Review of Systems  Per HPI unless specifically indicated above     Objective:   Pt to have vitals signs taken when he comes in for labs in a few days There were no vitals taken for this visit.  Wt Readings from Last 3 Encounters:  06/04/18 205 lb 11.2 oz (93.3 kg)  12/03/17 201 lb 9.6 oz (91.4 kg)  11/05/17 204 lb 1.6 oz (92.6 kg)    Physical Exam Vitals signs and nursing note reviewed.  Constitutional:      General: He is not in acute distress.    Appearance: Normal appearance.  HENT:  Head: Atraumatic.     Right Ear: External ear normal.     Left Ear: External ear normal.     Nose:     Comments: Nasal mucosa significantly erythematous and edematous, rhinorrhea present    Mouth/Throat:     Mouth: Mucous membranes are moist.     Pharynx: Posterior oropharyngeal erythema present. No oropharyngeal exudate.  Eyes:     Extraocular Movements: Extraocular movements intact.     Comments: Conjunctiva mildly erythematous b/l   Neck:     Musculoskeletal: Normal range of motion.  Pulmonary:     Effort: Pulmonary effort is normal. No respiratory distress.  Musculoskeletal: Normal range of motion.  Skin:    General: Skin is dry.      Findings: No erythema or rash.  Neurological:     Mental Status: He is oriented to person, place, and time.  Psychiatric:        Mood and Affect: Mood normal.        Thought Content: Thought content normal.        Judgment: Judgment normal.     Results for orders placed or performed in visit on 06/04/18  Lipid Panel w/o Chol/HDL Ratio  Result Value Ref Range   Cholesterol, Total 210 (H) 100 - 199 mg/dL   Triglycerides 524 (H) 0 - 149 mg/dL   HDL 46 >81 mg/dL   VLDL Cholesterol Cal 40 5 - 40 mg/dL   LDL Calculated 859 (H) 0 - 99 mg/dL  Comprehensive metabolic panel  Result Value Ref Range   Glucose 99 65 - 99 mg/dL   BUN 8 6 - 20 mg/dL   Creatinine, Ser 0.93 0.76 - 1.27 mg/dL   GFR calc non Af Amer 105 >59 mL/min/1.73   GFR calc Af Amer 121 >59 mL/min/1.73   BUN/Creatinine Ratio 9 9 - 20   Sodium 138 134 - 144 mmol/L   Potassium 4.2 3.5 - 5.2 mmol/L   Chloride 101 96 - 106 mmol/L   CO2 21 20 - 29 mmol/L   Calcium 9.7 8.7 - 10.2 mg/dL   Total Protein 7.4 6.0 - 8.5 g/dL   Albumin 4.4 3.5 - 5.5 g/dL   Globulin, Total 3.0 1.5 - 4.5 g/dL   Albumin/Globulin Ratio 1.5 1.2 - 2.2   Bilirubin Total 0.4 0.0 - 1.2 mg/dL   Alkaline Phosphatase 93 39 - 117 IU/L   AST 45 (H) 0 - 40 IU/L   ALT 47 (H) 0 - 44 IU/L      Assessment & Plan:   Problem List Items Addressed This Visit      Respiratory   Allergic rhinitis    Referral placed to Allergy specialist for further management given excellent compliance with regimen and persistent uncontrolled sxs. Continue current regimen in meantime      Relevant Orders   Ambulatory referral to Allergy     Digestive   GERD (gastroesophageal reflux disease)    Stable, continue protonix.         Other   Anxiety    Chronic, controlled well on current regimen. Continue prozac      Hyperlipidemia - Primary    Recheck lipids, adjust dose as needed. Discussed good lifestyle modifications      Relevant Orders   Comprehensive metabolic  panel (Completed)   Lipid Panel w/o Chol/HDL Ratio (Completed)       Follow up plan: Return in about 6 months (around 06/05/2019) for CPE.

## 2018-12-05 ENCOUNTER — Other Ambulatory Visit: Payer: 59

## 2018-12-05 DIAGNOSIS — E785 Hyperlipidemia, unspecified: Secondary | ICD-10-CM | POA: Diagnosis not present

## 2018-12-06 LAB — COMPREHENSIVE METABOLIC PANEL
ALT: 42 IU/L (ref 0–44)
AST: 37 IU/L (ref 0–40)
Albumin/Globulin Ratio: 1.8 (ref 1.2–2.2)
Albumin: 4.3 g/dL (ref 4.0–5.0)
Alkaline Phosphatase: 84 IU/L (ref 39–117)
BUN/Creatinine Ratio: 10 (ref 9–20)
BUN: 10 mg/dL (ref 6–20)
Bilirubin Total: 0.2 mg/dL (ref 0.0–1.2)
CO2: 21 mmol/L (ref 20–29)
Calcium: 9.2 mg/dL (ref 8.7–10.2)
Chloride: 105 mmol/L (ref 96–106)
Creatinine, Ser: 0.97 mg/dL (ref 0.76–1.27)
GFR calc Af Amer: 114 mL/min/{1.73_m2} (ref >59)
GFR calc non Af Amer: 99 mL/min/{1.73_m2} (ref >59)
Globulin, Total: 2.4 g/dL (ref 1.5–4.5)
Glucose: 115 mg/dL — ABNORMAL HIGH (ref 65–99)
Potassium: 4.2 mmol/L (ref 3.5–5.2)
Sodium: 141 mmol/L (ref 134–144)
Total Protein: 6.7 g/dL (ref 6.0–8.5)

## 2018-12-06 LAB — LIPID PANEL W/O CHOL/HDL RATIO
Cholesterol, Total: 180 mg/dL (ref 100–199)
HDL: 42 mg/dL (ref >39)
LDL Calculated: 94 mg/dL (ref 0–99)
Triglycerides: 218 mg/dL — ABNORMAL HIGH (ref 0–149)
VLDL Cholesterol Cal: 44 mg/dL — ABNORMAL HIGH (ref 5–40)

## 2018-12-08 NOTE — Assessment & Plan Note (Signed)
Stable, continue protonix

## 2018-12-08 NOTE — Assessment & Plan Note (Signed)
Referral placed to Allergy specialist for further management given excellent compliance with regimen and persistent uncontrolled sxs. Continue current regimen in meantime

## 2018-12-08 NOTE — Assessment & Plan Note (Signed)
Chronic, controlled well on current regimen. Continue prozac

## 2018-12-08 NOTE — Assessment & Plan Note (Signed)
Recheck lipids, adjust dose as needed. Discussed good lifestyle modifications

## 2018-12-19 DIAGNOSIS — J301 Allergic rhinitis due to pollen: Secondary | ICD-10-CM | POA: Diagnosis not present

## 2018-12-19 DIAGNOSIS — J3081 Allergic rhinitis due to animal (cat) (dog) hair and dander: Secondary | ICD-10-CM | POA: Diagnosis not present

## 2018-12-19 DIAGNOSIS — J3089 Other allergic rhinitis: Secondary | ICD-10-CM | POA: Diagnosis not present

## 2019-01-06 ENCOUNTER — Encounter: Payer: Self-pay | Admitting: Family Medicine

## 2019-01-07 MED ORDER — MONTELUKAST SODIUM 10 MG PO TABS: 10.0000 mg | ORAL_TABLET | Freq: Every day | ORAL | 3 refills | Status: DC

## 2019-01-07 MED ORDER — ATORVASTATIN CALCIUM 40 MG PO TABS: 40.0000 mg | ORAL_TABLET | Freq: Every day | ORAL | 1 refills | Status: DC

## 2019-01-15 ENCOUNTER — Encounter: Payer: Self-pay | Admitting: Family Medicine

## 2019-01-15 ENCOUNTER — Other Ambulatory Visit: Payer: Self-pay | Admitting: Family Medicine

## 2019-01-15 MED ORDER — LEVOCETIRIZINE DIHYDROCHLORIDE 5 MG PO TABS: 5.0000 mg | ORAL_TABLET | Freq: Two times a day (BID) | ORAL | 11 refills | Status: DC

## 2019-03-06 ENCOUNTER — Encounter: Payer: Self-pay | Admitting: Family Medicine

## 2019-03-06 ENCOUNTER — Other Ambulatory Visit: Payer: Self-pay

## 2019-03-06 ENCOUNTER — Ambulatory Visit (INDEPENDENT_AMBULATORY_CARE_PROVIDER_SITE_OTHER): Payer: 59 | Admitting: Family Medicine

## 2019-03-06 VITALS — BP 132/86 | HR 103 | Temp 98.4°F | Ht 71.75 in | Wt 217.0 lb

## 2019-03-06 DIAGNOSIS — K219 Gastro-esophageal reflux disease without esophagitis: Secondary | ICD-10-CM | POA: Diagnosis not present

## 2019-03-06 DIAGNOSIS — F419 Anxiety disorder, unspecified: Secondary | ICD-10-CM

## 2019-03-06 DIAGNOSIS — E785 Hyperlipidemia, unspecified: Secondary | ICD-10-CM

## 2019-03-06 DIAGNOSIS — J309 Allergic rhinitis, unspecified: Secondary | ICD-10-CM

## 2019-03-06 DIAGNOSIS — Z0000 Encounter for general adult medical examination without abnormal findings: Secondary | ICD-10-CM | POA: Diagnosis not present

## 2019-03-06 MED ORDER — PANTOPRAZOLE SODIUM 40 MG PO TBEC: 40.0000 mg | DELAYED_RELEASE_TABLET | Freq: Every day | ORAL | 1 refills | Status: DC

## 2019-03-06 MED ORDER — FLUOXETINE HCL 20 MG PO TABS: 20.0000 mg | ORAL_TABLET | Freq: Every day | ORAL | 1 refills | Status: DC

## 2019-03-06 MED ORDER — FLUTICASONE PROPIONATE 50 MCG/ACT NA SUSP: 2.0000 | Freq: Two times a day (BID) | NASAL | 11 refills | Status: DC

## 2019-03-06 NOTE — Progress Notes (Signed)
BP 132/86   Pulse (!) 103   Temp 98.4 F (36.9 C) (Oral)   Ht 5' 11.75" (1.822 m)   Wt 217 lb (98.4 kg)   SpO2 98%   BMI 29.64 kg/m    Subjective:    Patient ID: Ricky NordmannJoseph Tyer Jr., male    DOB: 19-Jun-1981, 38 y.o.   MRN: [id number]  HPI: Ricky NordmannJoseph Labella Jr. is a 38 y.o. male presenting on 03/06/2019 for comprehensive medical examination. Current medical complaints include:see below  HLD - tolerating the lipitor well, trying to be better about watching what he eats and staying active. Denies CP, SOB, myalgias, claudication.   Anxiety doing well on prozac, no frequent panic episodes, mood concerns, SI/HI, sleep or appetite issues.   Allergies doing some better -  Currently on singulair, xyzal, flonase regimen and seeing an Allergy Specialist.   He currently lives with: Interim Problems from his last visit: no  Depression Screen done today and results listed below:  Dep[facility] screen [facility] 2/9 12/04/2018 12/03/2017  Decreased Interest 0 0  Down, Depressed, Hopeless 0 0  PHQ - 2 Score 0 0  Altered sleeping 0 0  Tired, decreased energy 0 0  Change in appetite 0 0  Feeling bad or failure about yourself  0 0  Trouble concentrating 0 0  Moving slowly or fidgety/restless 0 0  Suicidal thoughts 0 0  PHQ-9 Score 0 0  Difficult doing work/chores Not difficult at all -    The patient does not have a history of falls. I did not complete a risk assessment for falls. A plan of care for falls was not documented.   Past Medical History:  Past Medical History:  Diagnosis Date  . Seasonal allergies     Surgical History:  History reviewed. No pertinent surgical history.  Medications:  Current Outpatient Medications on File Prior to Visit  Medication Sig  . atorvastatin (LIPITOR) 40 MG tablet Take 1 tablet (40 mg total) by mouth daily.  Marland Kitchen. levocetirizine (XYZAL) 5 MG tablet Take 1 tablet (5 mg total) by mouth 2 (two) times daily.  . montelukast (SINGULAIR) 10 MG tablet Take 1 tablet  (10 mg total) by mouth at bedtime.  . triamcinolone cream (KENALOG) 0.1 % Apply 1 application topically 2 (two) times daily. (Patient not taking: Reported on 03/06/2019)   No current facility-administered medications on file prior to visit.     Allergies:  No Known Allergies  Social History:  Social History   Socioeconomic History  . Marital status: Single    Spouse name: Not on file  . Number of children: Not on file  . Years of education: Not on file  . Highest education level: Not on file  Occupational History  . Not on file  Social Needs  . Financial resource strain: Not on file  . Food insecurity    Worry: Not on file    Inability: Not on file  . Transportation needs    Medical: Not on file    Non-medical: Not on file  Tobacco Use  . Smoking status: Current Every Day Smoker    Packs/day: 0.50    Types: Cigarettes  . Smokeless tobacco: Never Used  Substance and Sexual Activity  . Alcohol use: Yes    Alcohol/week: 2.0 standard drinks    Types: 2 Cans of beer per week    Comment: per day, liquor on the weekends  . Drug use: No  . Sexual activity: Not on file  Lifestyle  .  Physical activity    Days per week: Not on file    Minutes per session: Not on file  . Stress: Not on file  Relationships  . Social Musicianconnections    Talks on phone: Not on file    Gets together: Not on file    Attends religious service: Not on file    Active member of club or organization: Not on file    Attends meetings of clubs or organizations: Not on file    Relationship status: Not on file  . Intimate partner violence    Fear of current or ex partner: Not on file    Emotionally abused: Not on file    Physically abused: Not on file    Forced sexual activity: Not on file  Other Topics Concern  . Not on file  Social History Narrative  . Not on file   Social History   Tobacco Use  Smoking Status Current Every Day Smoker  . Packs/day: 0.50  . Types: Cigarettes  Smokeless Tobacco  Never Used   Social History   Substance and Sexual Activity  Alcohol Use Yes  . Alcohol/week: 2.0 standard drinks  . Types: 2 Cans of beer per week   Comment: per day, liquor on the weekends    Family History:  Family History  Problem Relation Age of Onset  . Cancer Mother     Past medical history, surgical history, medications, allergies, family history and social history reviewed with patient today and changes made to appropriate areas of the chart.   Review of Systems - General ROS: negative Psychological ROS: negative Ophthalmic ROS: negative ENT ROS: negative Allergy and Immunology ROS: negative Hematological and Lymphatic ROS: negative Endocrine ROS: negative Breast ROS: negative for breast lumps Respiratory ROS: no cough, shortness of breath, or wheezing Cardiovascular ROS: no chest pain or dyspnea on exertion Gastrointestinal ROS: no abdominal pain, change in bowel habits, or black or bloody stools Genito-Urinary ROS: no dysuria, trouble voiding, or hematuria Musculoskeletal ROS: negative Neurological ROS: no TIA or stroke symptoms Dermatological ROS: negative All other ROS negative except what is listed above and in the HPI.      Objective:    BP 132/86   Pulse (!) 103   Temp 98.4 F (36.9 C) (Oral)   Ht 5' 11.75" (1.822 m)   Wt 217 lb (98.4 kg)   SpO2 98%   BMI 29.64 kg/m   Wt Readings from Last 3 Encounters:  03/06/19 217 lb (98.4 kg)  06/04/18 205 lb 11.2 oz (93.3 kg)  12/03/17 201 lb 9.6 oz (91.4 kg)    Physical Exam Vitals signs and nursing note reviewed.  Constitutional:      General: He is not in acute distress.    Appearance: He is well-developed.  HENT:     Head: Atraumatic.     Right Ear: Tympanic membrane and external ear normal.     Left Ear: Tympanic membrane and external ear normal.     Nose: Nose normal.     Comments: Nasal mucosa erythematous    Mouth/Throat:     Mouth: Mucous membranes are moist.     Pharynx: Posterior  oropharyngeal erythema (mild posterior erythema) present.  Eyes:     General: No scleral icterus.    Conjunctiva/sclera: Conjunctivae normal.     Pupils: Pupils are equal, round, and reactive to light.  Neck:     Musculoskeletal: Normal range of motion and neck supple.  Cardiovascular:     Rate and  Rhythm: Normal rate and regular rhythm.     Heart sounds: Normal heart sounds. No murmur.  Pulmonary:     Effort: Pulmonary effort is normal. No respiratory distress.     Breath sounds: Normal breath sounds.  Abdominal:     General: Bowel sounds are normal. There is no distension.     Palpations: Abdomen is soft. There is no mass.     Tenderness: There is no abdominal tenderness. There is no guarding.  Genitourinary:    Comments: Declines GU exam Musculoskeletal: Normal range of motion.        General: No tenderness.  Skin:    General: Skin is warm and dry.     Findings: No rash.  Neurological:     General: No focal deficit present.     Mental Status: He is alert and oriented to person, place, and time.     Deep Tendon Reflexes: Reflexes are normal and symmetric.  Psychiatric:        Mood and Affect: Mood normal.        Behavior: Behavior normal.        Thought Content: Thought content normal.        Judgment: Judgment normal.     Results for orders placed or performed in visit on 03/06/19  CBC with Differential/Platelet  Result Value Ref Range   WBC 7.3 3.4 - 10.8 x10E3/uL   RBC 5.67 4.14 - 5.80 x10E6/uL   Hemoglobin 13.4 13.0 - 17.7 g/dL   Hematocrit 42.3 37.5 - 51.0 %   MCV 75 (L) 79 - 97 fL   MCH 23.6 (L) 26.6 - 33.0 pg   MCHC 31.7 31.5 - 35.7 g/dL   RDW 16.0 (H) 11.6 - 15.4 %   Platelets 267 150 - 450 x10E3/uL   Neutrophils 49 Not Estab. %   Lymphs 33 Not Estab. %   Monocytes 13 Not Estab. %   Eos 4 Not Estab. %   Basos 1 Not Estab. %   Neutrophils Absolute 3.6 1.4 - 7.0 x10E3/uL   Lymphocytes Absolute 2.4 0.7 - 3.1 x10E3/uL   Monocytes Absolute 0.9 0.1 - 0.9  x10E3/uL   EOS (ABSOLUTE) 0.3 0.0 - 0.4 x10E3/uL   Basophils Absolute 0.1 0.0 - 0.2 x10E3/uL   Immature Granulocytes 0 Not Estab. %   Immature Grans (Abs) 0.0 0.0 - 0.1 x10E3/uL  Comprehensive metabolic panel  Result Value Ref Range   Glucose 106 (H) 65 - 99 mg/dL   BUN 9 6 - 20 mg/dL   Creatinine, Ser 0.87 0.76 - 1.27 mg/dL   GFR calc non Af Amer 109 >59 mL/min/1.73   GFR calc Af Amer 127 >59 mL/min/1.73   BUN/Creatinine Ratio 10 9 - 20   Sodium 140 134 - 144 mmol/L   Potassium 4.2 3.5 - 5.2 mmol/L   Chloride 102 96 - 106 mmol/L   CO2 20 20 - 29 mmol/L   Calcium 8.9 8.7 - 10.2 mg/dL   Total Protein 7.0 6.0 - 8.5 g/dL   Albumin 4.3 4.0 - 5.0 g/dL   Globulin, Total 2.7 1.5 - 4.5 g/dL   Albumin/Globulin Ratio 1.6 1.2 - 2.2   Bilirubin Total 0.2 0.0 - 1.2 mg/dL   Alkaline Phosphatase 85 39 - 117 IU/L   AST 45 (H) 0 - 40 IU/L   ALT 45 (H) 0 - 44 IU/L  Lipid Panel w/o Chol/HDL Ratio  Result Value Ref Range   Cholesterol, Total 179 100 - 199 mg/dL   Triglycerides  168 (H) 0 - 149 mg/dL   HDL 45 >40>39 mg/dL   VLDL Cholesterol Cal 34 5 - 40 mg/dL   LDL Calculated 981100 (H) 0 - 99 mg/dL      Assessment & Plan:   Problem List Items Addressed This Visit      Respiratory   Allergic rhinitis    Stable and under fairly good control, continue current regimen and working with Allergist        Digestive   GERD (gastroesophageal reflux disease)    Stable on protonix, continue current regimen      Relevant Medications   pantoprazole (PROTONIX) 40 MG tablet     Other   Anxiety    Stable and under good control on prozac, continue current regimen      Relevant Medications   FLUoxetine (PROZAC) 20 MG tablet   Hyperlipidemia    Recheck lipids, adjust as needed. Continue current regimen      Relevant Orders   Comprehensive metabolic panel (Completed)   Lipid Panel w/o Chol/HDL Ratio (Completed)    Other Visit Diagnoses    Annual physical exam    -  Primary   Relevant Orders    CBC with Differential/Platelet (Completed)   UA/M w/rflx Culture, Routine       Discussed aspirin prophylaxis for myocardial infarction prevention and decision was it was not indicated  LABORATORY TESTING:  Health maintenance labs ordered today as discussed above.   The natural history of prostate cancer and ongoing controversy regarding screening and potential treatment outcomes of prostate cancer has been discussed with the patient. The meaning of a false positive PSA and a false negative PSA has been discussed. He indicates understanding of the limitations of this screening test and wishes not to proceed with screening PSA testing.   IMMUNIZATIONS:   - Tdap: Tetanus vaccination status reviewed: last tetanus booster within 10 years. - Influenza: Postponed to flu season  PATIENT COUNSELING:    Sexuality: Discussed sexually transmitted diseases, partner selection, use of condoms, avoidance of unintended pregnancy  and contraceptive alternatives.   Advised to avoid cigarette smoking.  I discussed with the patient that most people either abstain from alcohol or drink within safe limits (<=14/week and <=4 drinks/occasion for males, <=7/weeks and <= 3 drinks/occasion for females) and that the risk for alcohol disorders and other health effects rises proportionally with the number of drinks per week and how often a drinker exceeds daily limits.  Discussed cessation/primary prevention of drug use and availability of treatment for abuse.   Diet: Encouraged to adjust caloric intake to maintain  or achieve ideal body weight, to reduce intake of dietary saturated fat and total fat, to limit sodium intake by avoiding high sodium foods and not adding table salt, and to maintain adequate dietary potassium and calcium preferably from fresh fruits, vegetables, and low-fat dairy products.    stressed the importance of regular exercise  Injury prevention: Discussed safety belts, safety helmets, smoke  detector, smoking near bedding or upholstery.   Dental health: Discussed importance of regular tooth brushing, flossing, and dental visits.   Follow up plan: NEXT PREVENTATIVE PHYSICAL DUE IN 1 YEAR. Return in about 6 months (around 09/06/2019) for 6 month f/u.

## 2019-03-07 LAB — COMPREHENSIVE METABOLIC PANEL
ALT: 45 IU/L — ABNORMAL HIGH (ref 0–44)
AST: 45 IU/L — ABNORMAL HIGH (ref 0–40)
Albumin/Globulin Ratio: 1.6 (ref 1.2–2.2)
Albumin: 4.3 g/dL (ref 4.0–5.0)
Alkaline Phosphatase: 85 IU/L (ref 39–117)
BUN/Creatinine Ratio: 10 (ref 9–20)
BUN: 9 mg/dL (ref 6–20)
Bilirubin Total: 0.2 mg/dL (ref 0.0–1.2)
CO2: 20 mmol/L (ref 20–29)
Calcium: 8.9 mg/dL (ref 8.7–10.2)
Chloride: 102 mmol/L (ref 96–106)
Creatinine, Ser: 0.87 mg/dL (ref 0.76–1.27)
GFR calc Af Amer: 127 mL/min/{1.73_m2} (ref >59)
GFR calc non Af Amer: 109 mL/min/{1.73_m2} (ref >59)
Globulin, Total: 2.7 g/dL (ref 1.5–4.5)
Glucose: 106 mg/dL — ABNORMAL HIGH (ref 65–99)
Potassium: 4.2 mmol/L (ref 3.5–5.2)
Sodium: 140 mmol/L (ref 134–144)
Total Protein: 7 g/dL (ref 6.0–8.5)

## 2019-03-07 LAB — CBC WITH DIFFERENTIAL/PLATELET
Basophils Absolute: 0.1 10*3/uL (ref 0.0–0.2)
Basos: 1 %
EOS (ABSOLUTE): 0.3 10*3/uL (ref 0.0–0.4)
Eos: 4 %
Hematocrit: 42.3 % (ref 37.5–51.0)
Hemoglobin: 13.4 g/dL (ref 13.0–17.7)
Immature Grans (Abs): 0 10*3/uL (ref 0.0–0.1)
Immature Granulocytes: 0 %
Lymphocytes Absolute: 2.4 10*3/uL (ref 0.7–3.1)
Lymphs: 33 %
MCH: 23.6 pg — ABNORMAL LOW (ref 26.6–33.0)
MCHC: 31.7 g/dL (ref 31.5–35.7)
MCV: 75 fL — ABNORMAL LOW (ref 79–97)
Monocytes Absolute: 0.9 10*3/uL (ref 0.1–0.9)
Monocytes: 13 %
Neutrophils Absolute: 3.6 10*3/uL (ref 1.4–7.0)
Neutrophils: 49 %
Platelets: 267 10*3/uL (ref 150–450)
RBC: 5.67 x10E6/uL (ref 4.14–5.80)
RDW: 16 % — ABNORMAL HIGH (ref 11.6–15.4)
WBC: 7.3 10*3/uL (ref 3.4–10.8)

## 2019-03-07 LAB — LIPID PANEL W/O CHOL/HDL RATIO
Cholesterol, Total: 179 mg/dL (ref 100–199)
HDL: 45 mg/dL (ref >39)
LDL Calculated: 100 mg/dL — ABNORMAL HIGH (ref 0–99)
Triglycerides: 168 mg/dL — ABNORMAL HIGH (ref 0–149)
VLDL Cholesterol Cal: 34 mg/dL (ref 5–40)

## 2019-03-10 NOTE — Assessment & Plan Note (Signed)
Recheck lipids, adjust as needed. Continue current regimen 

## 2019-03-10 NOTE — Assessment & Plan Note (Signed)
Stable and under fairly good control, continue current regimen and working with [employer]

## 2019-03-10 NOTE — Assessment & Plan Note (Signed)
Stable and under good control on prozac, continue current regimen 

## 2019-03-10 NOTE — Assessment & Plan Note (Signed)
Stable on protonix, continue current regimen 

## 2019-09-10 ENCOUNTER — Other Ambulatory Visit: Payer: Self-pay

## 2019-09-10 ENCOUNTER — Encounter: Payer: Self-pay | Admitting: Family Medicine

## 2019-09-10 ENCOUNTER — Ambulatory Visit (INDEPENDENT_AMBULATORY_CARE_PROVIDER_SITE_OTHER): Payer: 59 | Admitting: Family Medicine

## 2019-09-10 VITALS — BP 120/84 | HR 98 | Temp 98.3°F | Ht 71.7 in | Wt 206.0 lb

## 2019-09-10 DIAGNOSIS — E785 Hyperlipidemia, unspecified: Secondary | ICD-10-CM

## 2019-09-10 DIAGNOSIS — J309 Allergic rhinitis, unspecified: Secondary | ICD-10-CM | POA: Diagnosis not present

## 2019-09-10 DIAGNOSIS — K219 Gastro-esophageal reflux disease without esophagitis: Secondary | ICD-10-CM

## 2019-09-10 DIAGNOSIS — F419 Anxiety disorder, unspecified: Secondary | ICD-10-CM

## 2019-09-10 MED ORDER — PANTOPRAZOLE SODIUM 40 MG PO TBEC: 40.0000 mg | DELAYED_RELEASE_TABLET | Freq: Every day | ORAL | 1 refills | Status: DC

## 2019-09-10 MED ORDER — ATORVASTATIN CALCIUM 40 MG PO TABS: 40.0000 mg | ORAL_TABLET | Freq: Every day | ORAL | 1 refills | Status: DC

## 2019-09-10 MED ORDER — FLUOXETINE HCL 20 MG PO TABS: 20.0000 mg | ORAL_TABLET | Freq: Every day | ORAL | 1 refills | Status: DC

## 2019-09-10 NOTE — Progress Notes (Signed)
BP 120/84   Pulse 98   Temp 98.3 F (36.8 C) (Oral)   Ht 5' 11.7" (1.821 m)   Wt 206 lb (93.4 kg)   SpO2 96%   BMI 28.17 kg/m    Subjective:    Patient ID: Ricky Anderson., male    DOB: November 22, 1980, 39 y.o.   MRN: [id number]  HPI: Ricky Weible. is a 39 y.o. male  Chief Complaint  Patient presents with  . Hyperlipidemia  . Gastroesophageal Reflux  . Anxiety   Patient presenting today for 6 month f/u chronic conditions. No new concerns today. States taking his medications pretty consistently without side effects. Not following strict diet, tries to stay active.   Allergies under good control with xyzal, singulair and flonase regimen.   GERD sxs controlled with protonix, no breakthrough sxs, melena, N/V/D.   Anxiety doing well on prozac, no panic episodes, irritability, mood swings, sleep issues.   Taking lipitor fairly consistently, not watching diet. Denies CP, SOB, myalgias, claudication.   Depression screen [facility] 2/9 09/10/2019 12/04/2018 12/03/2017  Decreased Interest 0 0 0  Down, Depressed, Hopeless 0 0 0  PHQ - 2 Score 0 0 0  Altered sleeping 0 0 0  Tired, decreased energy 0 0 0  Change in appetite 0 0 0  Feeling bad or failure about yourself  0 0 0  Trouble concentrating 0 0 0  Moving slowly or fidgety/restless 0 0 0  Suicidal thoughts 0 0 0  PHQ-9 Score 0 0 0  Difficult doing work/chores - Not difficult at all -   GAD 7 : Generalized Anxiety Score 09/10/2019 12/04/2018 12/03/2017  Nervous, Anxious, on Edge 0 0 0  Control/stop worrying 0 0 0  Worry too much - different things 0 0 0  Trouble relaxing 0 0 0  Restless 0 0 0  Easily annoyed or irritable 0 0 0  Afraid - awful might happen 0 0 0  Total GAD 7 Score 0 0 0  Anxiety Difficulty - Not difficult at all -     Relevant past medical, surgical, family and social history reviewed and updated as indicated. Interim medical history since our last visit reviewed. Allergies and medications reviewed and  updated.  Review of Systems  Per HPI unless specifically indicated above     Objective:    BP 120/84   Pulse 98   Temp 98.3 F (36.8 C) (Oral)   Ht 5' 11.7" (1.821 m)   Wt 206 lb (93.4 kg)   SpO2 96%   BMI 28.17 kg/m   Wt Readings from Last 3 Encounters:  09/10/19 206 lb (93.4 kg)  03/06/19 217 lb (98.4 kg)  06/04/18 205 lb 11.2 oz (93.3 kg)    Physical Exam Vitals and nursing note reviewed.  Constitutional:      Appearance: Normal appearance.  HENT:     Head: Atraumatic.     Right Ear: Tympanic membrane normal.     Left Ear: Tympanic membrane normal.     Nose: Nose normal.     Mouth/Throat:     Mouth: Mucous membranes are moist.     Pharynx: Oropharynx is clear.  Eyes:     Extraocular Movements: Extraocular movements intact.     Conjunctiva/sclera: Conjunctivae normal.  Cardiovascular:     Rate and Rhythm: Normal rate and regular rhythm.  Pulmonary:     Effort: Pulmonary effort is normal.     Breath sounds: Normal breath sounds.  Musculoskeletal:  General: Normal range of motion.     Cervical back: Normal range of motion and neck supple.  Skin:    General: Skin is warm and dry.  Neurological:     General: No focal deficit present.     Mental Status: He is oriented to person, place, and time.  Psychiatric:        Mood and Affect: Mood normal.        Thought Content: Thought content normal.        Judgment: Judgment normal.     Results for orders placed or performed in visit on 09/10/19  Comprehensive metabolic panel  Result Value Ref Range   Glucose 87 65 - 99 mg/dL   BUN 11 6 - 20 mg/dL   Creatinine, Ser 0.08 0.76 - 1.27 mg/dL   GFR calc non Af Amer 109 >59 mL/min/1.73   GFR calc Af Amer 127 >59 mL/min/1.73   BUN/Creatinine Ratio 13 9 - 20   Sodium 140 134 - 144 mmol/L   Potassium 4.1 3.5 - 5.2 mmol/L   Chloride 106 96 - 106 mmol/L   CO2 21 20 - 29 mmol/L   Calcium 9.6 8.7 - 10.2 mg/dL   Total Protein 7.0 6.0 - 8.5 g/dL   Albumin 4.4 4.0  - 5.0 g/dL   Globulin, Total 2.6 1.5 - 4.5 g/dL   Albumin/Globulin Ratio 1.7 1.2 - 2.2   Bilirubin Total 0.2 0.0 - 1.2 mg/dL   Alkaline Phosphatase 94 39 - 117 IU/L   AST 28 0 - 40 IU/L   ALT 25 0 - 44 IU/L  Lipid Panel w/o Chol/HDL Ratio  Result Value Ref Range   Cholesterol, Total 228 (H) 100 - 199 mg/dL   Triglycerides 676 (H) 0 - 149 mg/dL   HDL 41 >19 mg/dL   VLDL Cholesterol Cal 46 (H) 5 - 40 mg/dL   LDL Chol Calc (NIH) 509 (H) 0 - 99 mg/dL      Assessment & Plan:   Problem List Items Addressed This Visit      Respiratory   Allergic rhinitis    Stable and under good control, continue current regimen        Digestive   GERD (gastroesophageal reflux disease)    Stable and well controlled, continue current regimen      Relevant Medications   pantoprazole (PROTONIX) 40 MG tablet     Other   Anxiety    Stable and under good control, continue current regimen      Relevant Medications   FLUoxetine (PROZAC) 20 MG tablet   Hyperlipidemia - Primary    Stable, recheck lipids and adjust as needed. Work on diet and exercise changes      Relevant Medications   atorvastatin (LIPITOR) 40 MG tablet   Other Relevant Orders   Comprehensive metabolic panel (Completed)   Lipid Panel w/o Chol/HDL Ratio (Completed)       Follow up plan: Return in about 6 months (around 03/09/2020) for CPE.

## 2019-09-11 LAB — COMPREHENSIVE METABOLIC PANEL
ALT: 25 IU/L (ref 0–44)
AST: 28 IU/L (ref 0–40)
Albumin/Globulin Ratio: 1.7 (ref 1.2–2.2)
Albumin: 4.4 g/dL (ref 4.0–5.0)
Alkaline Phosphatase: 94 IU/L (ref 39–117)
BUN/Creatinine Ratio: 13 (ref 9–20)
BUN: 11 mg/dL (ref 6–20)
Bilirubin Total: 0.2 mg/dL (ref 0.0–1.2)
CO2: 21 mmol/L (ref 20–29)
Calcium: 9.6 mg/dL (ref 8.7–10.2)
Chloride: 106 mmol/L (ref 96–106)
Creatinine, Ser: 0.87 mg/dL (ref 0.76–1.27)
GFR calc Af Amer: 127 mL/min/{1.73_m2} (ref >59)
GFR calc non Af Amer: 109 mL/min/{1.73_m2} (ref >59)
Globulin, Total: 2.6 g/dL (ref 1.5–4.5)
Glucose: 87 mg/dL (ref 65–99)
Potassium: 4.1 mmol/L (ref 3.5–5.2)
Sodium: 140 mmol/L (ref 134–144)
Total Protein: 7 g/dL (ref 6.0–8.5)

## 2019-09-11 LAB — LIPID PANEL W/O CHOL/HDL RATIO
Cholesterol, Total: 228 mg/dL — ABNORMAL HIGH (ref 100–199)
HDL: 41 mg/dL (ref >39)
LDL Chol Calc (NIH): 141 mg/dL — ABNORMAL HIGH (ref 0–99)
Triglycerides: 253 mg/dL — ABNORMAL HIGH (ref 0–149)
VLDL Cholesterol Cal: 46 mg/dL — ABNORMAL HIGH (ref 5–40)

## 2019-09-12 NOTE — Assessment & Plan Note (Signed)
Stable and under good control, continue current regimen 

## 2019-09-12 NOTE — Assessment & Plan Note (Signed)
Stable and well controlled, continue current regimen 

## 2019-09-12 NOTE — Assessment & Plan Note (Signed)
Stable, recheck lipids and adjust as needed. Work on diet and exercise changes

## 2020-03-04 ENCOUNTER — Encounter: Payer: Self-pay | Admitting: Family Medicine

## 2020-03-05 ENCOUNTER — Other Ambulatory Visit: Payer: Self-pay | Admitting: Nurse Practitioner

## 2020-03-05 ENCOUNTER — Encounter: Payer: Self-pay | Admitting: Family Medicine

## 2020-03-05 MED ORDER — LEVOCETIRIZINE DIHYDROCHLORIDE 5 MG PO TABS: 5.0000 mg | ORAL_TABLET | Freq: Two times a day (BID) | ORAL | 11 refills | Status: DC

## 2020-03-05 MED ORDER — MONTELUKAST SODIUM 10 MG PO TABS: 10.0000 mg | ORAL_TABLET | Freq: Every day | ORAL | 3 refills | Status: DC

## 2020-03-05 NOTE — Telephone Encounter (Signed)
Needs appt; if none available then urgent care

## 2020-03-10 ENCOUNTER — Ambulatory Visit (INDEPENDENT_AMBULATORY_CARE_PROVIDER_SITE_OTHER): Payer: 59 | Admitting: Family Medicine

## 2020-03-10 ENCOUNTER — Other Ambulatory Visit: Payer: Self-pay

## 2020-03-10 ENCOUNTER — Encounter: Payer: Self-pay | Admitting: Family Medicine

## 2020-03-10 VITALS — BP 132/82 | HR 81 | Temp 98.2°F | Ht 72.0 in | Wt 209.0 lb

## 2020-03-10 DIAGNOSIS — E785 Hyperlipidemia, unspecified: Secondary | ICD-10-CM

## 2020-03-10 DIAGNOSIS — J309 Allergic rhinitis, unspecified: Secondary | ICD-10-CM

## 2020-03-10 DIAGNOSIS — Z1159 Encounter for screening for other viral diseases: Secondary | ICD-10-CM

## 2020-03-10 DIAGNOSIS — Z0000 Encounter for general adult medical examination without abnormal findings: Secondary | ICD-10-CM

## 2020-03-10 DIAGNOSIS — K219 Gastro-esophageal reflux disease without esophagitis: Secondary | ICD-10-CM

## 2020-03-10 DIAGNOSIS — F419 Anxiety disorder, unspecified: Secondary | ICD-10-CM | POA: Diagnosis not present

## 2020-03-10 LAB — UA/M W/RFLX CULTURE, ROUTINE
Bilirubin, UA: NEGATIVE
Glucose, UA: NEGATIVE
Ketones, UA: NEGATIVE
Leukocytes,UA: NEGATIVE
Nitrite, UA: NEGATIVE
Protein,UA: NEGATIVE
RBC, UA: NEGATIVE
Specific Gravity, UA: 1.025 (ref 1.005–1.030)
Urobilinogen, Ur: 0.2 mg/dL (ref 0.2–1.0)
pH, UA: 6 (ref 5.0–7.5)

## 2020-03-10 MED ORDER — ATORVASTATIN CALCIUM 40 MG PO TABS: 40.0000 mg | ORAL_TABLET | Freq: Every day | ORAL | 1 refills | Status: DC

## 2020-03-10 MED ORDER — PANTOPRAZOLE SODIUM 40 MG PO TBEC: 40.0000 mg | DELAYED_RELEASE_TABLET | Freq: Every day | ORAL | 1 refills | Status: DC

## 2020-03-10 MED ORDER — FLUOXETINE HCL 20 MG PO TABS: 20.0000 mg | ORAL_TABLET | Freq: Every day | ORAL | 1 refills | Status: DC

## 2020-03-10 NOTE — Assessment & Plan Note (Signed)
Chronic, stable and well controlled. Continue current regimen °

## 2020-03-10 NOTE — Progress Notes (Signed)
BP 132/82    Pulse 81    Temp 98.2 F (36.8 C) (Oral)    Ht 6' (1.829 m)    Wt 209 lb (94.8 kg)    SpO2 99%    BMI 28.35 kg/m    Subjective:    Patient ID: Ricky Nordmann., male    DOB: [birth year], 39 y.o.   MRN: [id number]  HPI: Ricky Viveros. is a 39 y.o. male presenting on 03/10/2020 for comprehensive medical examination. Current medical complaints include:see below  HLD - On lipitor, trying to eat healthy and stay active. Denies myalgias, claudication, Cp, SOB.   Allergies - Significantly improved control of sxs on singulair, xyzal, flonase regimen. Has not had an exacerbation in quite some time now.   GERD - On protonix which keeps reflux sxs at bay. Denies abdominal pain, melena, brash  Anxiety - continues to do well on prozac regimen. Keep anxiety under good control, no mood swings, minimal irritability. Denies side effects, SI/HI.   He currently lives with: Interim Problems from his last visit: no  Depression Screen done today and results listed below:  Depression screen [facility] 2/9 09/10/2019 12/04/2018 12/03/2017  Decreased Interest 0 0 0  Down, Depressed, Hopeless 0 0 0  PHQ - 2 Score 0 0 0  Altered sleeping 0 0 0  Tired, decreased energy 0 0 0  Change in appetite 0 0 0  Feeling bad or failure about yourself  0 0 0  Trouble concentrating 0 0 0  Moving slowly or fidgety/restless 0 0 0  Suicidal thoughts 0 0 0  PHQ-9 Score 0 0 0  Difficult doing work/chores - Not difficult at all -    The patient does not have a history of falls. I did complete a risk assessment for falls. A plan of care for falls was documented.   Past Medical History:  Past Medical History:  Diagnosis Date   Seasonal allergies     Surgical History:  History reviewed. No pertinent surgical history.  Medications:  Current Outpatient Medications on File Prior to Visit  Medication Sig   fluticasone (FLONASE) 50 MCG/ACT nasal spray Place 2 sprays into both nostrils 2 (two) times daily.     levocetirizine (XYZAL) 5 MG tablet Take 1 tablet (5 mg total) by mouth 2 (two) times daily.   montelukast (SINGULAIR) 10 MG tablet Take 1 tablet (10 mg total) by mouth at bedtime.   No current facility-administered medications on file prior to visit.    Allergies:  No Known Allergies  Social History:  Social History   Socioeconomic History   Marital status: Single    Spouse name: Not on file   Number of children: Not on file   Years of education: Not on file   Highest education level: Not on file  Occupational History   Not on file  Tobacco Use   Smoking status: Current Every Day Smoker    Packs/day: 0.50    Types: Cigarettes   Smokeless tobacco: Never Used  Vaping Use   Vaping Use: Never used  Substance and Sexual Activity   Alcohol use: Yes    Alcohol/week: 2.0 standard drinks    Types: 2 Cans of beer per week    Comment: per day, liquor on the weekends   Drug use: No   Sexual activity: Not on file  Other Topics Concern   Not on file  Social History Narrative   Not on file   Social Determinants of Health  Financial Resource Strain:    Difficulty of Paying Living Expenses:   Food Insecurity:    Worried About Programme researcher, broadcasting/film/video in the Last Year:    Barista in the Last Year:   Transportation Needs:    Freight forwarder (Medical):    Lack of Transportation (Non-Medical):   Physical Activity:    Days of Exercise per Week:    Minutes of Exercise per Session:   Stress:    Feeling of Stress :   Social Connections:    Frequency of Communication with Friends and Family:    Frequency of Social Gatherings with Friends and Family:    Attends Religious Services:    Active Member of Clubs or Organizations:    Attends Engineer, structural:    Marital Status:   Intimate Partner Violence:    Fear of Current or Ex-Partner:    Emotionally Abused:    Physically Abused:    Sexually Abused:    Social History    Tobacco Use  Smoking Status Current Every Day Smoker   Packs/day: 0.50   Types: Cigarettes  Smokeless Tobacco Never Used   Social History   Substance and Sexual Activity  Alcohol Use Yes   Alcohol/week: 2.0 standard drinks   Types: 2 Cans of beer per week   Comment: per day, liquor on the weekends    Family History:  Family History  Problem Relation Age of Onset   Cancer Mother     Past medical history, surgical history, medications, allergies, family history and social history reviewed with patient today and changes made to appropriate areas of the chart.   Review of Systems - General ROS: negative Psychological ROS: negative Ophthalmic ROS: negative ENT ROS: negative Allergy and Immunology ROS: negative Hematological and Lymphatic ROS: negative Endocrine ROS: negative Breast ROS: negative for breast lumps Respiratory ROS: no cough, shortness of breath, or wheezing Cardiovascular ROS: no chest pain or dyspnea on exertion Gastrointestinal ROS: no abdominal pain, change in bowel habits, or black or bloody stools Genito-Urinary ROS: no dysuria, trouble voiding, or hematuria Musculoskeletal ROS: negative Neurological ROS: no TIA or stroke symptoms Dermatological ROS: negative All other ROS negative except what is listed above and in the HPI.      Objective:    BP 132/82    Pulse 81    Temp 98.2 F (36.8 C) (Oral)    Ht 6' (1.829 m)    Wt 209 lb (94.8 kg)    SpO2 99%    BMI 28.35 kg/m   Wt Readings from Last 3 Encounters:  03/10/20 209 lb (94.8 kg)  09/10/19 206 lb (93.4 kg)  03/06/19 217 lb (98.4 kg)    Physical Exam Vitals and nursing note reviewed.  Constitutional:      General: He is not in acute distress.    Appearance: He is well-developed.  HENT:     Head: Atraumatic.     Right Ear: Tympanic membrane and external ear normal.     Left Ear: Tympanic membrane and external ear normal.     Nose: Nose normal.     Mouth/Throat:     Mouth: Mucous  membranes are moist.     Pharynx: Oropharynx is clear.  Eyes:     General: No scleral icterus.    Conjunctiva/sclera: Conjunctivae normal.     Pupils: Pupils are equal, round, and reactive to light.  Cardiovascular:     Rate and Rhythm: Normal rate and regular rhythm.  Heart sounds: Normal heart sounds. No murmur heard.   Pulmonary:     Effort: Pulmonary effort is normal. No respiratory distress.     Breath sounds: Normal breath sounds.  Abdominal:     General: Bowel sounds are normal. There is no distension.     Palpations: Abdomen is soft. There is no mass.     Tenderness: There is no abdominal tenderness. There is no guarding.  Genitourinary:    Comments: GU exam deferred with shared decision making Musculoskeletal:        General: No tenderness. Normal range of motion.     Cervical back: Normal range of motion and neck supple.  Skin:    General: Skin is warm and dry.     Findings: No rash.  Neurological:     General: No focal deficit present.     Mental Status: He is alert and oriented to person, place, and time.     Deep Tendon Reflexes: Reflexes are normal and symmetric.  Psychiatric:        Mood and Affect: Mood normal.        Behavior: Behavior normal.        Thought Content: Thought content normal.        Judgment: Judgment normal.     Results for orders placed or performed in visit on 09/10/19  Comprehensive metabolic panel  Result Value Ref Range   Glucose 87 65 - 99 mg/dL   BUN 11 6 - 20 mg/dL   Creatinine, Ser 3.47 0.76 - 1.27 mg/dL   GFR calc non Af Amer 109 >59 mL/min/1.73   GFR calc Af Amer 127 >59 mL/min/1.73   BUN/Creatinine Ratio 13 9 - 20   Sodium 140 134 - 144 mmol/L   Potassium 4.1 3.5 - 5.2 mmol/L   Chloride 106 96 - 106 mmol/L   CO2 21 20 - 29 mmol/L   Calcium 9.6 8.7 - 10.2 mg/dL   Total Protein 7.0 6.0 - 8.5 g/dL   Albumin 4.4 4.0 - 5.0 g/dL   Globulin, Total 2.6 1.5 - 4.5 g/dL   Albumin/Globulin Ratio 1.7 1.2 - 2.2   Bilirubin Total  0.2 0.0 - 1.2 mg/dL   Alkaline Phosphatase 94 39 - 117 IU/L   AST 28 0 - 40 IU/L   ALT 25 0 - 44 IU/L  Lipid Panel w/o Chol/HDL Ratio  Result Value Ref Range   Cholesterol, Total 228 (H) 100 - 199 mg/dL   Triglycerides 425 (H) 0 - 149 mg/dL   HDL 41 >95 mg/dL   VLDL Cholesterol Cal 46 (H) 5 - 40 mg/dL   LDL Chol Calc (NIH) 638 (H) 0 - 99 mg/dL      Assessment & Plan:   Problem List Items Addressed This Visit      Respiratory   Allergic rhinitis    Stable and well controlled, continue singulair, xyzal and flonase reigmen        Digestive   GERD (gastroesophageal reflux disease)    Chronic, stable and well controlled. Continue current regimen      Relevant Medications   pantoprazole (PROTONIX) 40 MG tablet     Other   Anxiety    Stable and under good control, continue prozac regimen      Relevant Medications   FLUoxetine (PROZAC) 20 MG tablet   Hyperlipidemia - Primary    Recheck lipids, adjust as needed. Continue lipitor regimen and diet and exercise modifications      Relevant Medications  atorvastatin (LIPITOR) 40 MG tablet   Other Relevant Orders   Comprehensive metabolic panel   Lipid Panel w/o Chol/HDL Ratio    Other Visit Diagnoses    Annual physical exam       Relevant Orders   CBC with Differential/Platelet   TSH   UA/M w/rflx Culture, Routine   Need for hepatitis C screening test       Relevant Orders   Hepatitis C antibody       Discussed aspirin prophylaxis for myocardial infarction prevention and decision was made to continue ASA  LABORATORY TESTING:  Health maintenance labs ordered today as discussed above.   The natural history of prostate cancer and ongoing controversy regarding screening and potential treatment outcomes of prostate cancer has been discussed with the patient. The meaning of a false positive PSA and a false negative PSA has been discussed. He indicates understanding of the limitations of this screening test and wishes not  to proceed with screening PSA testing.   IMMUNIZATIONS:   - Tdap: Tetanus vaccination status reviewed: last tetanus booster within 10 years. - Influenza: Up to date  PATIENT COUNSELING:    Sexuality: Discussed sexually transmitted diseases, partner selection, use of condoms, avoidance of unintended pregnancy  and contraceptive alternatives.   Advised to avoid cigarette smoking.  I discussed with the patient that most people either abstain from alcohol or drink within safe limits (<=14/week and <=4 drinks/occasion for males, <=7/weeks and <= 3 drinks/occasion for females) and that the risk for alcohol disorders and other health effects rises proportionally with the number of drinks per week and how often a drinker exceeds daily limits.  Discussed cessation/primary prevention of drug use and availability of treatment for abuse.   Diet: Encouraged to adjust caloric intake to maintain  or achieve ideal body weight, to reduce intake of dietary saturated fat and total fat, to limit sodium intake by avoiding high sodium foods and not adding table salt, and to maintain adequate dietary potassium and calcium preferably from fresh fruits, vegetables, and low-fat dairy products.    stressed the importance of regular exercise  Injury prevention: Discussed safety belts, safety helmets, smoke detector, smoking near bedding or upholstery.   Dental health: Discussed importance of regular tooth brushing, flossing, and dental visits.   Follow up plan: NEXT PREVENTATIVE PHYSICAL DUE IN 1 YEAR. Return in about 6 months (around 09/10/2020) for 6 month f/u.

## 2020-03-10 NOTE — Assessment & Plan Note (Signed)
Stable and under good control, continue prozac regimen 

## 2020-03-10 NOTE — Assessment & Plan Note (Signed)
Stable and well controlled, continue singulair, xyzal and flonase reigmen

## 2020-03-10 NOTE — Assessment & Plan Note (Signed)
Recheck lipids, adjust as needed. Continue lipitor regimen and diet and exercise modifications

## 2020-03-11 LAB — CBC WITH DIFFERENTIAL/PLATELET
Basophils Absolute: 0.1 10*3/uL (ref 0.0–0.2)
Basos: 1 %
EOS (ABSOLUTE): 0.3 10*3/uL (ref 0.0–0.4)
Eos: 4 %
Hematocrit: 40.3 % (ref 37.5–51.0)
Hemoglobin: 12.5 g/dL — ABNORMAL LOW (ref 13.0–17.7)
Immature Grans (Abs): 0 10*3/uL (ref 0.0–0.1)
Immature Granulocytes: 1 %
Lymphocytes Absolute: 2.6 10*3/uL (ref 0.7–3.1)
Lymphs: 31 %
MCH: 23.3 pg — ABNORMAL LOW (ref 26.6–33.0)
MCHC: 31 g/dL — ABNORMAL LOW (ref 31.5–35.7)
MCV: 75 fL — ABNORMAL LOW (ref 79–97)
Monocytes Absolute: 0.9 10*3/uL (ref 0.1–0.9)
Monocytes: 11 %
Neutrophils Absolute: 4.5 10*3/uL (ref 1.4–7.0)
Neutrophils: 52 %
Platelets: 259 10*3/uL (ref 150–450)
RBC: 5.36 x10E6/uL (ref 4.14–5.80)
RDW: 16.4 % — ABNORMAL HIGH (ref 11.6–15.4)
WBC: 8.4 10*3/uL (ref 3.4–10.8)

## 2020-03-11 LAB — COMPREHENSIVE METABOLIC PANEL
ALT: 33 IU/L (ref 0–44)
AST: 34 IU/L (ref 0–40)
Albumin/Globulin Ratio: 1.6 (ref 1.2–2.2)
Albumin: 4.5 g/dL (ref 4.0–5.0)
Alkaline Phosphatase: 90 IU/L (ref 48–121)
BUN/Creatinine Ratio: 9 (ref 9–20)
BUN: 9 mg/dL (ref 6–20)
Bilirubin Total: 0.2 mg/dL (ref 0.0–1.2)
CO2: 23 mmol/L (ref 20–29)
Calcium: 9.3 mg/dL (ref 8.7–10.2)
Chloride: 102 mmol/L (ref 96–106)
Creatinine, Ser: 0.97 mg/dL (ref 0.76–1.27)
GFR calc Af Amer: 113 mL/min/{1.73_m2} (ref >59)
GFR calc non Af Amer: 98 mL/min/{1.73_m2} (ref >59)
Globulin, Total: 2.8 g/dL (ref 1.5–4.5)
Glucose: 111 mg/dL — ABNORMAL HIGH (ref 65–99)
Potassium: 4 mmol/L (ref 3.5–5.2)
Sodium: 138 mmol/L (ref 134–144)
Total Protein: 7.3 g/dL (ref 6.0–8.5)

## 2020-03-11 LAB — LIPID PANEL W/O CHOL/HDL RATIO
Cholesterol, Total: 244 mg/dL — ABNORMAL HIGH (ref 100–199)
HDL: 47 mg/dL (ref >39)
LDL Chol Calc (NIH): 166 mg/dL — ABNORMAL HIGH (ref 0–99)
Triglycerides: 167 mg/dL — ABNORMAL HIGH (ref 0–149)
VLDL Cholesterol Cal: 31 mg/dL (ref 5–40)

## 2020-03-11 LAB — TSH: TSH: 1.08 u[IU]/mL (ref 0.450–4.500)

## 2020-03-11 LAB — HEPATITIS C ANTIBODY: Hep C Virus Ab: <0.1 s/co ratio (ref 0.0–0.9)

## 2020-03-18 ENCOUNTER — Other Ambulatory Visit: Payer: Self-pay | Admitting: Family Medicine

## 2020-03-18 DIAGNOSIS — R7309 Other abnormal glucose: Secondary | ICD-10-CM

## 2020-03-18 MED ORDER — ATORVASTATIN CALCIUM 80 MG PO TABS: 80.0000 mg | ORAL_TABLET | Freq: Every day | ORAL | 1 refills | Status: DC

## 2020-04-05 ENCOUNTER — Encounter: Payer: Self-pay | Admitting: Family Medicine

## 2020-08-18 ENCOUNTER — Telehealth (INDEPENDENT_AMBULATORY_CARE_PROVIDER_SITE_OTHER): Payer: 59 | Admitting: Family Medicine

## 2020-08-18 ENCOUNTER — Encounter: Payer: Self-pay | Admitting: Family Medicine

## 2020-08-18 DIAGNOSIS — U071 COVID-19: Secondary | ICD-10-CM | POA: Diagnosis not present

## 2020-08-18 MED ORDER — BENZONATATE 200 MG PO CAPS
200.0000 mg | ORAL_CAPSULE | Freq: Two times a day (BID) | ORAL | 0 refills | Status: DC | PRN
Start: 2020-08-18 — End: 2020-09-17

## 2020-08-18 NOTE — Patient Instructions (Addendum)
It was great to see you!  Our plans for today:  - I sent the cough medicine to the pharmacy. - See below for self-isolation guidelines. You may end your quarantine once you are 10 days from symptom onset and fever free for 24 hours without use of tylenol or ibuprofen.  - Someone will be contacting you about antibody infusion treatment. - I recommend getting your booster once you are healed from your current infection. If you get the antibody infusion, you must wait 3 months before vaccination.  - Certainly, if you are having difficulties breathing or unable to keep down fluids, go to the Emergency Department.   Take care and seek immediate care sooner if you develop any concerns.   Dr. Linwood Dibbles     Person Under Monitoring Name: Ricky Anderson.  Location: [address] [place] [address]   Infection Prevention Recommendations for Individuals Confirmed to have, or Being Evaluated for, 2019 Novel Coronavirus (COVID-19) Infection Who Receive Care at Home  Individuals who are confirmed to have, or are being evaluated for, COVID-19 should follow the prevention steps below until a healthcare provider or local or state health department says they can return to normal activities.  Stay home except to get medical care You should restrict activities outside your home, except for getting medical care. Do not go to work, school, or public areas, and do not use public transportation or taxis.  Call ahead before visiting your doctor Before your medical appointment, call the healthcare provider and tell them that you have, or are being evaluated for, COVID-19 infection. This will help the healthcare provider's office take steps to keep other people from getting infected. Ask your healthcare provider to call the local or state health department.  Monitor your symptoms Seek prompt medical attention if your illness is worsening (e.g., difficulty breathing). Before going to your  medical appointment, call the healthcare provider and tell them that you have, or are being evaluated for, COVID-19 infection. Ask your healthcare provider to call the local or state health department.  Wear a facemask You should wear a facemask that covers your nose and mouth when you are in the same room with other people and when you visit a healthcare provider. People who live with or visit you should also wear a facemask while they are in the same room with you.  Separate yourself from other people in your home As much as possible, you should stay in a different room from other people in your home. Also, you should use a separate bathroom, if available.  Avoid sharing household items You should not share dishes, drinking glasses, cups, eating utensils, towels, bedding, or other items with other people in your home. After using these items, you should wash them thoroughly with soap and water.  Cover your coughs and sneezes Cover your mouth and nose with a tissue when you cough or sneeze, or you can cough or sneeze into your sleeve. Throw used tissues in a lined trash can, and immediately wash your hands with soap and water for at least 20 seconds or use an alcohol-based hand rub.  Wash your [employer] your hands often and thoroughly with soap and water for at least 20 seconds. You can use an alcohol-based hand sanitizer if soap and water are not available and if your hands are not visibly dirty. Avoid touching your eyes, nose, and mouth with unwashed hands.   Prevention Steps for Caregivers and Household Members of Individuals Confirmed to  have, or Being Evaluated for, COVID-19 Infection Being Cared for in the Home  If you live with, or provide care at home for, a person confirmed to have, or being evaluated for, COVID-19 infection please follow these guidelines to prevent infection:  Follow healthcare provider's instructions Make sure that you understand and can help the  patient follow any healthcare provider instructions for all care.  Provide for the patient's basic needs You should help the patient with basic needs in the home and provide support for getting groceries, prescriptions, and other personal needs.  Monitor the patient's symptoms If they are getting sicker, call his or her medical provider and tell them that the patient has, or is being evaluated for, COVID-19 infection. This will help the healthcare provider's office take steps to keep other people from getting infected. Ask the healthcare provider to call the local or state health department.  Limit the number of people who have contact with the patient  If possible, have only one caregiver for the patient.  Other household members should stay in another home or place of residence. If this is not possible, they should stay  in another room, or be separated from the patient as much as possible. Use a separate bathroom, if available.  Restrict visitors who do not have an essential need to be in the home.  Keep older adults, very young children, and other sick people away from the patient Keep older adults, very young children, and those who have compromised immune systems or chronic health conditions away from the patient. This includes people with chronic heart, lung, or kidney conditions, diabetes, and cancer.  Ensure good ventilation Make sure that shared spaces in the home have good air flow, such as from an air conditioner or an opened window, weather permitting.  Wash your hands often  Wash your hands often and thoroughly with soap and water for at least 20 seconds. You can use an alcohol based hand sanitizer if soap and water are not available and if your hands are not visibly dirty.  Avoid touching your eyes, nose, and mouth with unwashed hands.  Use disposable paper towels to dry your hands. If not available, use dedicated cloth towels and replace them when they become  wet.  Wear a facemask and gloves  Wear a disposable facemask at all times in the room and gloves when you touch or have contact with the patient's blood, body fluids, and/or secretions or excretions, such as sweat, saliva, sputum, nasal mucus, vomit, urine, or feces.  Ensure the mask fits over your nose and mouth tightly, and do not touch it during use.  Throw out disposable facemasks and gloves after using them. Do not reuse.  Wash your hands immediately after removing your facemask and gloves.  If your personal clothing becomes contaminated, carefully remove clothing and launder. Wash your hands after handling contaminated clothing.  Place all used disposable facemasks, gloves, and other waste in a lined container before disposing them with other household waste.  Remove gloves and wash your hands immediately after handling these items.  Do not share dishes, glasses, or other household items with the patient  Avoid sharing household items. You should not share dishes, drinking glasses, cups, eating utensils, towels, bedding, or other items with a patient who is confirmed to have, or being evaluated for, COVID-19 infection.  After the person uses these items, you should wash them thoroughly with soap and water.  Wash laundry thoroughly  Immediately remove and wash clothes  or bedding that have blood, body fluids, and/or secretions or excretions, such as sweat, saliva, sputum, nasal mucus, vomit, urine, or feces, on them.  Wear gloves when handling laundry from the patient.  Read and follow directions on labels of laundry or clothing items and detergent. In general, wash and dry with the warmest temperatures recommended on the label.  Clean all areas the individual has used often  Clean all touchable surfaces, such as counters, tabletops, doorknobs, bathroom fixtures, toilets, phones, keyboards, tablets, and bedside tables, every day. Also, clean any surfaces that may have blood, body  fluids, and/or secretions or excretions on them.  Wear gloves when cleaning surfaces the patient has come in contact with.  Use a diluted bleach solution (e.g., dilute bleach with 1 part bleach and 10 parts water) or a household disinfectant with a label that says EPA-registered for coronaviruses. To make a bleach solution at home, add 1 tablespoon of bleach to 1 quart (4 cups) of water. For a larger supply, add  cup of bleach to 1 gallon (16 cups) of water.  Read labels of cleaning products and follow recommendations provided on product labels. Labels contain instructions for safe and effective use of the cleaning product including precautions you should take when applying the product, such as wearing gloves or eye protection and making sure you have good ventilation during use of the product.  Remove gloves and wash hands immediately after cleaning.  Monitor yourself for signs and symptoms of illness Caregivers and household members are considered close contacts, should monitor their health, and will be asked to limit movement outside of the home to the extent possible. Follow the monitoring steps for close contacts listed on the symptom monitoring form.   ? If you have additional questions, contact your local health department or call the epidemiologist on call at [phone] (available 24/7). ? This guidance is subject to change. For the most up-to-date guidance from [facility], please refer to their website: [email or web address]

## 2020-08-18 NOTE — Assessment & Plan Note (Signed)
Referred for MAB tx. Note provided for work. Continue OTC symptom relief, tessalon rx sent. Reviewed self-quarantine guidelines and emergency precautions.

## 2020-08-18 NOTE — Progress Notes (Signed)
Virtual Visit via Video Note  I connected with Ricky Anderson. on 08/18/20 at  4:20 PM EST by a video enabled telemedicine application and verified that I am speaking with the correct person using two identifiers.  Location: Patient: home Provider: [facility]   I discussed the limitations of evaluation and management by telemedicine and the availability of in person appointments. The patient expressed understanding and agreed to proceed.  History of Present Illness:  UPPER RESPIRATORY TRACT INFECTION - +COVID exposure - symptom onset this morning with positive COVID test at [facility] today. Worst symptom: body aches, headaches, cough Fever: no Cough: yes, sometimes productive.  Shortness of breath: no Wheezing: no Chest pain: a little Chest tightness: no Chest congestion: no Nasal congestion: yes Runny nose: yes Sneezing: yes Sore throat: no Sinus pressure: yes Headache: yes Ear pain: no  Ear pressure: no  Vomiting: no Sick contacts: yes Recurrent sinusitis: no Relief with OTC cold/cough medications: no  Treatments attempted: cold/sinus, nyquil, tylenol severe cold Working currently. Needs a note for work.   Observations/Objective:  Tired appearing, in NAD. Speaks in full sentences, no respiratory distress.  Assessment and Plan:  COVID-19 Referred for MAB tx. Note provided for work. Continue OTC symptom relief, tessalon rx sent. Reviewed self-quarantine guidelines and emergency precautions.    I discussed the assessment and treatment plan with the patient. The patient was provided an opportunity to ask questions and all were answered. The patient agreed with the plan and demonstrated an understanding of the instructions.   The patient was advised to call back or seek an in-person evaluation if the symptoms worsen or if the condition fails to improve as anticipated.  I provided 10 minutes of non-face-to-face time during this encounter.   Caro Laroche, DO

## 2020-08-25 DIAGNOSIS — Z8616 Personal history of COVID-19: Secondary | ICD-10-CM | POA: Insufficient documentation

## 2020-09-10 ENCOUNTER — Ambulatory Visit: Payer: 59 | Admitting: Family Medicine

## 2020-09-17 ENCOUNTER — Other Ambulatory Visit: Payer: Self-pay

## 2020-09-17 ENCOUNTER — Encounter: Payer: Self-pay | Admitting: Nurse Practitioner

## 2020-09-17 ENCOUNTER — Ambulatory Visit (INDEPENDENT_AMBULATORY_CARE_PROVIDER_SITE_OTHER): Payer: 59 | Admitting: Nurse Practitioner

## 2020-09-17 VITALS — BP 137/77 | HR 86 | Temp 98.8°F | Wt 212.4 lb

## 2020-09-17 DIAGNOSIS — F419 Anxiety disorder, unspecified: Secondary | ICD-10-CM

## 2020-09-17 DIAGNOSIS — E785 Hyperlipidemia, unspecified: Secondary | ICD-10-CM

## 2020-09-17 DIAGNOSIS — R7309 Other abnormal glucose: Secondary | ICD-10-CM

## 2020-09-17 MED ORDER — PANTOPRAZOLE SODIUM 40 MG PO TBEC: 40.0000 mg | DELAYED_RELEASE_TABLET | Freq: Every day | ORAL | 1 refills | Status: DC

## 2020-09-17 MED ORDER — LEVOCETIRIZINE DIHYDROCHLORIDE 5 MG PO TABS: 5.0000 mg | ORAL_TABLET | Freq: Two times a day (BID) | ORAL | 11 refills | Status: DC

## 2020-09-17 MED ORDER — ATORVASTATIN CALCIUM 80 MG PO TABS
80.0000 mg | ORAL_TABLET | Freq: Every day | ORAL | 1 refills | Status: DC
Start: 2020-09-17 — End: 2021-04-19

## 2020-09-17 MED ORDER — MONTELUKAST SODIUM 10 MG PO TABS: 10.0000 mg | ORAL_TABLET | Freq: Every day | ORAL | 3 refills | Status: DC

## 2020-09-17 MED ORDER — FLUTICASONE PROPIONATE 50 MCG/ACT NA SUSP
2.0000 | Freq: Two times a day (BID) | NASAL | 11 refills | Status: DC
Start: 2020-09-17 — End: 2021-10-06

## 2020-09-17 MED ORDER — FLUOXETINE HCL 20 MG PO TABS: 20.0000 mg | ORAL_TABLET | Freq: Every day | ORAL | 1 refills | Status: DC

## 2020-09-17 NOTE — Patient Instructions (Addendum)
High Cholesterol  High cholesterol is a condition in which the blood has high levels of a white, waxy substance similar to fat (cholesterol). The liver makes all the cholesterol that the body needs. The human body needs small amounts of cholesterol to help build cells. A person gets extra or excess cholesterol from the food that he or she eats. The blood carries cholesterol from the liver to the rest of the body. If you have high cholesterol, deposits (plaques) may build up on the walls of your arteries. Arteries are the blood vessels that carry blood away from your heart. These plaques make the arteries narrow and stiff. Cholesterol plaques increase your risk for heart attack and stroke. Work with your health care provider to keep your cholesterol levels in a healthy range. What increases the risk? The following factors may make you more likely to develop this condition:  Eating foods that are high in animal fat (saturated fat) or cholesterol.  Being overweight.  Not getting enough exercise.  A family history of high cholesterol (familial hypercholesterolemia).  Use of tobacco products.  Having diabetes. What are the signs or symptoms? There are no symptoms of this condition. How is this diagnosed? This condition may be diagnosed based on the results of a blood test.  If you are older than 40 years of age, your health care provider may check your cholesterol levels every 4-6 years.  You may be checked more often if you have high cholesterol or other risk factors for heart disease. The blood test for cholesterol measures:  "Bad" cholesterol, or LDL cholesterol. This is the main type of cholesterol that causes heart disease. The desired level is less than 100 mg/dL.  "Good" cholesterol, or HDL cholesterol. HDL helps protect against heart disease by cleaning the arteries and carrying the LDL to the liver for processing. The desired level for HDL is 60 mg/dL or higher.  Triglycerides.  These are fats that your body can store or burn for energy. The desired level is less than 150 mg/dL.  Total cholesterol. This measures the total amount of cholesterol in your blood and includes LDL, HDL, and triglycerides. The desired level is less than 200 mg/dL. How is this treated? This condition may be treated with:  Diet changes. You may be asked to eat foods that have more fiber and less saturated fats or added sugar.  Lifestyle changes. These may include regular exercise, maintaining a healthy weight, and quitting use of tobacco products.  Medicines. These are given when diet and lifestyle changes have not worked. You may be prescribed a statin medicine to help lower your cholesterol levels. Follow these instructions at home: Eating and drinking  Eat a healthy, balanced diet. This diet includes: ? Daily servings of a variety of fresh, frozen, or canned fruits and vegetables. ? Daily servings of whole grain foods that are rich in fiber. ? Foods that are low in saturated fats and trans fats. These include poultry and fish without skin, lean cuts of meat, and low-fat dairy products. ? A variety of fish, especially oily fish that contain omega-3 fatty acids. Aim to eat fish at least 2 times a week.  Avoid foods and drinks that have added sugar.  Use healthy cooking methods, such as roasting, grilling, broiling, baking, poaching, steaming, and stir-frying. Do not fry your food except for stir-frying.   Lifestyle  Get regular exercise. Aim to exercise for a total of 150 minutes a week. Increase your activity level by doing activities   such as gardening, walking, and taking the stairs.  Do not use any products that contain nicotine or tobacco, such as cigarettes, e-cigarettes, and chewing tobacco. If you need help quitting, ask your health care provider.   General instructions  Take over-the-counter and prescription medicines only as told by your health care provider.  Keep all  follow-up visits as told by your health care provider. This is important. Where to find more information  American Heart Association: www.heart.org  National Heart, Lung, and Blood Institute: PopSteam.is Contact a health care provider if:  You have trouble achieving or maintaining a healthy diet or weight.  You are starting an exercise program.  You are unable to stop smoking. Get help right away if:  You have chest pain.  You have trouble breathing.  You have any symptoms of a stroke. "BE FAST" is an easy way to remember the main warning signs of a stroke: ? B - Balance. Signs are dizziness, sudden trouble walking, or loss of balance. ? E - Eyes. Signs are trouble seeing or a sudden change in vision. ? F - Face. Signs are sudden weakness or numbness of the face, or the face or eyelid drooping on one side. ? A - Arms. Signs are weakness or numbness in an arm. This happens suddenly and usually on one side of the body. ? S - Speech. Signs are sudden trouble speaking, slurred speech, or trouble understanding what people say. ? T - Time. Time to call emergency services. Write down what time symptoms started.  You have other signs of a stroke, such as: ? A sudden, severe headache with no known cause. ? Nausea or vomiting. ? Seizure. These symptoms may represent a serious problem that is an emergency. Do not wait to see if the symptoms will go away. Get medical help right away. Call your local emergency services (911 in the [place] Do not drive yourself to the hospital. Summary  Cholesterol plaques increase your risk for heart attack and stroke. Work with your health care provider to keep your cholesterol levels in a healthy range.  Eat a healthy, balanced diet, get regular exercise, and maintain a healthy weight.  Do not use any products that contain nicotine or tobacco, such as cigarettes, e-cigarettes, and chewing tobacco.  Get help right away if you have any symptoms of a  stroke. This information is not intended to replace advice given to you by your health care provider. Make sure you discuss any questions you have with your health care provider. Document Revised: 07/07/2019 Document Reviewed: 07/07/2019 Elsevier Patient Education  2021 [employer]. Managing Anxiety, Adult After being diagnosed with an anxiety disorder, you may be relieved to know why you have felt or behaved a certain way. You may also feel overwhelmed about the treatment ahead and what it will mean for your life. With care and support, you can manage this condition and recover from it. How to manage lifestyle changes Managing stress and anxiety Stress is your body's reaction to life changes and events, both good and bad. Most stress will last just a few hours, but stress can be ongoing and can lead to more than just stress. Although stress can play a major role in anxiety, it is not the same as anxiety. Stress is usually caused by something external, such as a deadline, test, or competition. Stress normally passes after the triggering event has ended.  Anxiety is caused by something internal, such as imagining a terrible outcome or worrying that  something will go wrong that will devastate you. Anxiety often does not go away even after the triggering event is over, and it can become long-term (chronic) worry. It is important to understand the differences between stress and anxiety and to manage your stress effectively so that it does not lead to an anxious response. Talk with your health care provider or a counselor to learn more about reducing anxiety and stress. He or she may suggest tension reduction techniques, such as:  Music therapy. This can include creating or listening to music that you enjoy and that inspires you.  Mindfulness-based meditation. This involves being aware of your normal breaths while not trying to control your breathing. It can be done while sitting or walking.  Centering  prayer. This involves focusing on a word, phrase, or sacred image that means something to you and brings you peace.  Deep breathing. To do this, expand your stomach and inhale slowly through your nose. Hold your breath for 3-5 seconds. Then exhale slowly, letting your stomach muscles relax.  Self-talk. This involves identifying thought patterns that lead to anxiety reactions and changing those patterns.  Muscle relaxation. This involves tensing muscles and then relaxing them. Choose a tension reduction technique that suits your lifestyle and personality. These techniques take time and practice. Set aside 5-15 minutes a day to do them. Therapists can offer counseling and training in these techniques. The training to help with anxiety may be covered by some insurance plans. Other things you can do to manage stress and anxiety include:  Keeping a stress/anxiety diary. This can help you learn what triggers your reaction and then learn ways to manage your response.  Thinking about how you react to certain situations. You may not be able to control everything, but you can control your response.  Making time for activities that help you relax and not feeling guilty about spending your time in this way.  Visual imagery and yoga can help you stay calm and relax.   Medicines Medicines can help ease symptoms. Medicines for anxiety include:  Anti-anxiety drugs.  Antidepressants. Medicines are often used as a primary treatment for anxiety disorder. Medicines will be prescribed by a health care provider. When used together, medicines, psychotherapy, and tension reduction techniques may be the most effective treatment. Relationships Relationships can play a big part in helping you recover. Try to spend more time connecting with trusted friends and family members. Consider going to couples counseling, taking family education classes, or going to family therapy. Therapy can help you and others better  understand your condition. How to recognize changes in your anxiety Everyone responds differently to treatment for anxiety. Recovery from anxiety happens when symptoms decrease and stop interfering with your daily activities at home or work. This may mean that you will start to:  Have better concentration and focus. Worry will interfere less in your daily thinking.  Sleep better.  Be less irritable.  Have more energy.  Have improved memory. It is important to recognize when your condition is getting worse. Contact your health care provider if your symptoms interfere with home or work and you feel like your condition is not improving. Follow these instructions at home: Activity  Exercise. Most adults should do the following: ? Exercise for at least 150 minutes each week. The exercise should increase your heart rate and make you sweat (moderate-intensity exercise). ? Strengthening exercises at least twice a week.  Get the right amount and quality of sleep. Most adults need 7-9  hours of sleep each night. Lifestyle  Eat a healthy diet that includes plenty of vegetables, fruits, whole grains, low-fat dairy products, and lean protein. Do not eat a lot of foods that are high in solid fats, added sugars, or salt.  Make choices that simplify your life.  Do not use any products that contain nicotine or tobacco, such as cigarettes, e-cigarettes, and chewing tobacco. If you need help quitting, ask your health care provider.  Avoid caffeine, alcohol, and certain over-the-counter cold medicines. These may make you feel worse. Ask your pharmacist which medicines to avoid.   General instructions  Take over-the-counter and prescription medicines only as told by your health care provider.  Keep all follow-up visits as told by your health care provider. This is important. Where to find support You can get help and support from these sources:  Self-help groups.  Online and [employer]  A trusted spiritual leader.  Couples counseling.  Family education classes.  Family therapy. Where to find more information You may find that joining a support group helps you deal with your anxiety. The following sources can help you locate counselors or support groups near you:  Mental Health America: www.mentalhealthamerica.net  Anxiety and Depression Association of [place] (ADAA): ProgramCam.de  [employer] Mental Illness (NAMI): www.nami.org Contact a health care provider if you:  Have a hard time staying focused or finishing daily tasks.  Spend many hours a day feeling worried about everyday life.  Become exhausted by worry.  Start to have headaches, feel tense, or have nausea.  Urinate more than normal.  Have diarrhea. Get help right away if you have:  A racing heart and shortness of breath.  Thoughts of hurting yourself or others. If you ever feel like you may hurt yourself or others, or have thoughts about taking your own life, get help right away. You can go to your nearest emergency department or call:  Your local emergency services (911 in the [place]  A suicide crisis helpline, such as the National Suicide Prevention Lifeline at 601-665-3475. This is open 24 hours a day. Summary  Taking steps to learn and use tension reduction techniques can help calm you and help prevent triggering an anxiety reaction.  When used together, medicines, psychotherapy, and tension reduction techniques may be the most effective treatment.  Family, friends, and partners can play a big part in helping you recover from an anxiety disorder. This information is not intended to replace advice given to you by your health care provider. Make sure you discuss any questions you have with your health care provider. Document Revised: 01/07/2019 Document Reviewed: 01/07/2019 Elsevier Patient Education  2021 [employer].

## 2020-09-17 NOTE — Progress Notes (Addendum)
BP 137/77   Pulse 86   Temp 98.8 F (37.1 C) (Oral)   Wt 212 lb 6.4 oz (96.3 kg)   SpO2 97%   BMI 28.81 kg/m    Subjective:    Patient ID: Ricky Anderson., male    DOB: November 22, 1980, 40 y.o.   MRN: [id number]  HPI: Ricky Anderson. is a 40 y.o. male  Chief Complaint  Patient presents with  . Anxiety  . Hyperlipidemia  . Hypertension   HYPERLIPIDEMIA Hyperlipidemia status: excellent compliance Satisfied with current treatment?  yes Side effects:  no Medication compliance: excellent compliance Past cholesterol meds: Lipitor Supplements: none Aspirin:  no The ASCVD Risk score Denman George DC Jr., et al., 2013) failed to calculate for the following reasons:   The 2013 ASCVD risk score is only valid for ages 29 to 33 Chest pain:  no Coronary artery disease:  no Family history CAD:  no Family history early CAD:  no   ANXIETY/STRESS Duration:stable Anxious mood: no  Excessive worrying: no Irritability: no  Sweating: no Nausea: no Palpitations:no Hyperventilation: no Panic attacks: no Agoraphobia: no  Obscessions/compulsions: no Depressed mood: no Depression screen [facility] 2/9 09/17/2020 03/10/2020 09/10/2019 12/04/2018 12/03/2017  Decreased Interest 0 0 0 0 0  Down, Depressed, Hopeless 0 0 0 0 0  PHQ - 2 Score 0 0 0 0 0  Altered sleeping 0 0 0 0 0  Tired, decreased energy 0 0 0 0 0  Change in appetite 0 0 0 0 0  Feeling bad or failure about yourself  0 0 0 0 0  Trouble concentrating 0 0 0 0 0  Moving slowly or fidgety/restless 0 0 0 0 0  Suicidal thoughts 0 0 0 0 0  PHQ-9 Score 0 0 0 0 0  Difficult doing work/chores Not difficult at all - - Not difficult at all -   Anhedonia: no Weight changes: no Insomnia: no  Hypersomnia: no Fatigue/loss of energy: no Feelings of worthlessness: no Feelings of guilt: no Impaired concentration/indecisiveness: no Suicidal ideations: no  Crying spells: no Recent Stressors/Life Changes: no   Relationship problems: no   Family  stress: no     Financial stress: no    Job stress: no    Recent death/loss: no  Patient endorses polydipsia and cramping in his hands.   Relevant past medical, surgical, family and social history reviewed and updated as indicated. Interim medical history since our last visit reviewed. Allergies and medications reviewed and updated.  Review of Systems  Per HPI unless specifically indicated above     Objective:    BP 137/77   Pulse 86   Temp 98.8 F (37.1 C) (Oral)   Wt 212 lb 6.4 oz (96.3 kg)   SpO2 97%   BMI 28.81 kg/m   Wt Readings from Last 3 Encounters:  09/17/20 212 lb 6.4 oz (96.3 kg)  03/10/20 209 lb (94.8 kg)  09/10/19 206 lb (93.4 kg)    Physical Exam Vitals and nursing note reviewed.  Constitutional:      General: He is not in acute distress.    Appearance: Normal appearance. He is not ill-appearing, toxic-appearing or diaphoretic.  HENT:     Head: Normocephalic.     Right Ear: External ear normal.     Left Ear: External ear normal.     Nose: Nose normal. No congestion or rhinorrhea.     Mouth/Throat:     Mouth: Mucous membranes are moist.  Eyes:  General:        Right eye: No discharge.        Left eye: No discharge.     Extraocular Movements: Extraocular movements intact.     Conjunctiva/sclera: Conjunctivae normal.     Pupils: Pupils are equal, round, and reactive to light.  Cardiovascular:     Rate and Rhythm: Normal rate and regular rhythm.     Heart sounds: No murmur heard.   Pulmonary:     Effort: Pulmonary effort is normal. No respiratory distress.     Breath sounds: Normal breath sounds. No wheezing, rhonchi or rales.  Abdominal:     General: Abdomen is flat. Bowel sounds are normal.  Musculoskeletal:     Cervical back: Normal range of motion and neck supple.  Skin:    General: Skin is warm and dry.     Capillary Refill: Capillary refill takes less than 2 seconds.  Neurological:     General: No focal deficit present.     Mental  Status: He is alert and oriented to person, place, and time.  Psychiatric:        Mood and Affect: Mood normal.        Behavior: Behavior normal.        Thought Content: Thought content normal.        Judgment: Judgment normal.     Results for orders placed or performed in visit on 09/17/20  HgB A1c  Result Value Ref Range   Hgb A1c MFr Bld 5.6 4.8 - 5.6 %   Est. average glucose Bld gHb Est-mCnc 114 mg/dL  Lipid Profile  Result Value Ref Range   Cholesterol, Total 190 100 - 199 mg/dL   Triglycerides 301 (H) 0 - 149 mg/dL   HDL 37 (L) >60 mg/dL   VLDL Cholesterol Cal 38 5 - 40 mg/dL   LDL Chol Calc (NIH) 109 (H) 0 - 99 mg/dL   Chol/HDL Ratio 5.1 (H) 0.0 - 5.0 ratio  Comprehensive metabolic panel  Result Value Ref Range   Glucose 121 (H) 65 - 99 mg/dL   BUN 9 6 - 20 mg/dL   Creatinine, Ser 3.23 0.76 - 1.27 mg/dL   GFR calc non Af Amer 96 >59 mL/min/1.73   GFR calc Af Amer 110 >59 mL/min/1.73   BUN/Creatinine Ratio 9 9 - 20   Sodium 141 134 - 144 mmol/L   Potassium 4.3 3.5 - 5.2 mmol/L   Chloride 105 96 - 106 mmol/L   CO2 21 20 - 29 mmol/L   Calcium 9.2 8.7 - 10.2 mg/dL   Total Protein 7.0 6.0 - 8.5 g/dL   Albumin 4.2 4.0 - 5.0 g/dL   Globulin, Total 2.8 1.5 - 4.5 g/dL   Albumin/Globulin Ratio 1.5 1.2 - 2.2   Bilirubin Total <0.2 0.0 - 1.2 mg/dL   Alkaline Phosphatase 96 44 - 121 IU/L   AST 38 0 - 40 IU/L   ALT 38 0 - 44 IU/L      Assessment & Plan:   Problem List Items Addressed This Visit      Other   Anxiety    Stable.  Continue with current medication regimen.      Relevant Medications   FLUoxetine (PROZAC) 20 MG tablet   Hyperlipidemia - Primary    Stable.  Continue with current medicatoin regimen.  Lipid and CMP checked today.      Relevant Medications   atorvastatin (LIPITOR) 80 MG tablet   Other Relevant Orders  Comprehensive metabolic panel   Lipid Profile (Completed)    Other Visit Diagnoses    Elevated glucose  (Chronic)      A1c drawn at  visit today.   Relevant Orders   Comprehensive metabolic panel   HgB A1c (Completed)       Follow up plan: Return in about 6 months (around 03/17/2021) for Physical and Fasting labs.

## 2020-09-18 LAB — COMPREHENSIVE METABOLIC PANEL
ALT: 38 IU/L (ref 0–44)
AST: 38 IU/L (ref 0–40)
Albumin/Globulin Ratio: 1.5 (ref 1.2–2.2)
Albumin: 4.2 g/dL (ref 4.0–5.0)
Alkaline Phosphatase: 96 IU/L (ref 44–121)
BUN/Creatinine Ratio: 9 (ref 9–20)
BUN: 9 mg/dL (ref 6–20)
Bilirubin Total: <0.2 mg/dL (ref 0.0–1.2)
CO2: 21 mmol/L (ref 20–29)
Calcium: 9.2 mg/dL (ref 8.7–10.2)
Chloride: 105 mmol/L (ref 96–106)
Creatinine, Ser: 0.99 mg/dL (ref 0.76–1.27)
GFR calc Af Amer: 110 mL/min/{1.73_m2} (ref >59)
GFR calc non Af Amer: 96 mL/min/{1.73_m2} (ref >59)
Globulin, Total: 2.8 g/dL (ref 1.5–4.5)
Glucose: 121 mg/dL — ABNORMAL HIGH (ref 65–99)
Potassium: 4.3 mmol/L (ref 3.5–5.2)
Sodium: 141 mmol/L (ref 134–144)
Total Protein: 7 g/dL (ref 6.0–8.5)

## 2020-09-18 LAB — LIPID PANEL
Chol/HDL Ratio: 5.1 ratio — ABNORMAL HIGH (ref 0.0–5.0)
Cholesterol, Total: 190 mg/dL (ref 100–199)
HDL: 37 mg/dL — ABNORMAL LOW (ref >39)
LDL Chol Calc (NIH): 115 mg/dL — ABNORMAL HIGH (ref 0–99)
Triglycerides: 216 mg/dL — ABNORMAL HIGH (ref 0–149)
VLDL Cholesterol Cal: 38 mg/dL (ref 5–40)

## 2020-09-18 LAB — HEMOGLOBIN A1C
Est. average glucose Bld gHb Est-mCnc: 114 mg/dL
Hgb A1c MFr Bld: 5.6 % (ref 4.8–5.6)

## 2020-09-19 NOTE — Progress Notes (Signed)
Ricky Anderson, a few of your A/Ps don't seem like they're crossing over- just wanted to make sure I let you know.

## 2020-09-20 NOTE — Assessment & Plan Note (Signed)
Stable.  Continue with current medication regimen.  

## 2020-09-20 NOTE — Progress Notes (Signed)
Patient notified

## 2020-09-20 NOTE — Progress Notes (Signed)
Please let patient know that his lab work shows that he does not have diabetes.  This is something that we will continue to monitor at future visits due to his elevated fasting glucose level.  Cholesterol is improving overall.  Triglycerides are likely elevated due to patient not fasting at appointment since it was late in the day.  Continue with a low fat diet, medication, and exercise for 5x weekly for at least 30 minutes.  Other lab work is stable.  It was a pleasure meeting you last week. I look forward to our next visit.  Let me know if you need anything.

## 2020-09-20 NOTE — Assessment & Plan Note (Signed)
Stable.  Continue with current medicatoin regimen.  Lipid and CMP checked today.

## 2021-03-16 NOTE — Progress Notes (Deleted)
There were no vitals taken for this visit.   Subjective:    Patient ID: Ricky Nordmann., male    DOB: [birth year], 40 y.o.   MRN: [id number]  HPI: Ricky Mccamish. is a 40 y.o. male presenting on 03/17/2021 for comprehensive medical examination. Current medical complaints include:{Blank single:19197::"none","***"}  He currently lives with: Interim Problems from his last visit: {Blank single:19197::"yes","no"}  GERD GERD control status: {Blank single:19197::"controlled","uncontrolled","better","worse","exacerbated","stable"}Satisfied with current treatment? {Blank single:19197::"yes","no"} Heartburn frequency:  Medication side effects: {Blank single:19197::"yes","no"}  Medication compliance: {Blank multiple:19196::"better","worse","stable","fluctuating"} Previous GERD medications: Antacid use frequency:   Duration:  Nature:  Location:  Heartburn duration:  Alleviatiating factors:   Aggravating factors:  Dysphagia: {Blank single:19197::"yes","no"} Odynophagia:  {Blank single:19197::"yes","no"} Hematemesis: {Blank single:19197::"yes","no"} Blood in stool: {Blank single:19197::"yes","no"} EGD: {Blank single:19197::"yes","no"}  HYPERLIPIDEMIA Hyperlipidemia status: {Blank single:19197::"excellent compliance","good compliance","fair compliance","poor compliance"} Satisfied with current treatment?  {Blank single:19197::"yes","no"} Side effects:  {Blank single:19197::"yes","no"} Medication compliance: {Blank single:19197::"excellent compliance","good compliance","fair compliance","poor compliance"} Past cholesterol meds: {Blank multiple:19196::"none","atorvastain (lipitor)","lovastatin (mevacor)","pravastatin (pravachol)","rosuvastatin (crestor)","simvastatin (zocor)","vytorin","fenofibrate (tricor)","gemfibrozil","ezetimide (zetia)","niaspan","lovaza"} Supplements: {Blank multiple:19196::"none","fish oil","niacin","red yeast rice"} Aspirin:  {Blank single:19197::"yes","no"} The  10-year ASCVD risk score Ricky George DC Jr., Ricky al., Ricky Anderson) is: 6.4%   Values used to calculate the score:     Age: 37 years     Sex: Male     Is Non-Hispanic African American: Yes     Diabetic: No     Tobacco smoker: Yes     Systolic Blood Pressure: 137 mmHg     Is BP treated: No     HDL Cholesterol: 37 mg/dL     Total Cholesterol: 190 mg/dL Chest pain:  {Blank DSKAJG:81157::"WIO","MB"} Coronary artery disease:  {Blank single:19197::"yes","no"} Family history CAD:  {Blank single:19197::"yes","no"} Family history early CAD:  {Blank single:19197::"yes","no"}  ANXIETY Depression Screen done today and results listed below:  Depression screen [facility] 2/9 09/17/2020 03/10/2020 09/10/2019 12/04/2018 12/03/2017  Decreased Interest 0 0 0 0 0  Down, Depressed, Hopeless 0 0 0 0 0  PHQ - 2 Score 0 0 0 0 0  Altered sleeping 0 0 0 0 0  Tired, decreased energy 0 0 0 0 0  Change in appetite 0 0 0 0 0  Feeling bad or failure about yourself  0 0 0 0 0  Trouble concentrating 0 0 0 0 0  Moving slowly or fidgety/restless 0 0 0 0 0  Suicidal thoughts 0 0 0 0 0  PHQ-9 Score 0 0 0 0 0  Difficult doing work/chores Not difficult at all - - Not difficult at all -    The patient {has/does not TDHR:41638} a history of falls. I {did/did not:19850} complete a risk assessment for falls. A plan of care for falls {was/was not:19852} documented.   Past Medical History:  Past Medical History:  Diagnosis Date   Seasonal allergies     Surgical History:  No past surgical history on file.  Medications:  Current Outpatient Medications on File Prior to Visit  Medication Sig   atorvastatin (LIPITOR) 80 MG tablet Take 1 tablet (80 mg total) by mouth daily.   FLUoxetine (PROZAC) 20 MG tablet Take 1 tablet (20 mg total) by mouth daily.   fluticasone (FLONASE) 50 MCG/ACT nasal spray Place 2 sprays into both nostrils 2 (two) times daily.   levocetirizine (XYZAL) 5 MG tablet Take 1 tablet (5 mg total) by mouth 2 (two) times  daily.   montelukast (SINGULAIR) 10 MG tablet Take 1 tablet (10 mg total) by mouth at bedtime.   pantoprazole (PROTONIX) 40  MG tablet Take 1 tablet (40 mg total) by mouth daily.   No current facility-administered medications on file prior to visit.    Allergies:  No Known Allergies  Social History:  Social History   Socioeconomic History   Marital status: Single    Spouse name: Not on file   Number of children: Not on file   Years of education: Not on file   Highest education level: Not on file  Occupational History   Not on file  Tobacco Use   Smoking status: Every Day    Packs/day: 0.50    Types: Cigarettes   Smokeless tobacco: Never  Vaping Use   Vaping Use: Never used  Substance and Sexual Activity   Alcohol use: Yes    Alcohol/week: 2.0 standard drinks    Types: 2 Cans of beer per week    Comment: per day, liquor on the weekends   Drug use: No   Sexual activity: Not on file  Other Topics Concern   Not on file  Social History Narrative   Not on file   Social Determinants of Health   Financial Resource Strain: Not on file  Food Insecurity: Not on file  Transportation Needs: Not on file  Physical Activity: Not on file  Stress: Not on file  Social Connections: Not on file  Intimate Partner Violence: Not on file   Social History   Tobacco Use  Smoking Status Every Day   Packs/day: 0.50   Types: Cigarettes  Smokeless Tobacco Never   Social History   Substance and Sexual Activity  Alcohol Use Yes   Alcohol/week: 2.0 standard drinks   Types: 2 Cans of beer per week   Comment: per day, liquor on the weekends    Family History:  Family History  Problem Relation Age of Onset   Cancer Mother     Past medical history, surgical history, medications, allergies, family history and social history reviewed with patient today and changes made to appropriate areas of the chart.   ROS All other ROS negative except what is listed above and in the HPI.       Objective:    There were no vitals taken for this visit.  Wt Readings from Last 3 Encounters:  09/17/20 212 lb 6.4 oz (96.3 kg)  03/10/20 209 lb (94.8 kg)  09/10/19 206 lb (93.4 kg)    Physical Exam  Results for orders placed or performed in visit on 09/17/20  HgB A1c  Result Value Ref Range   Hgb A1c MFr Bld 5.6 4.8 - 5.6 %   Est. average glucose Bld gHb Est-mCnc 114 mg/dL  Lipid Profile  Result Value Ref Range   Cholesterol, Total 190 100 - 199 mg/dL   Triglycerides 832 (H) 0 - 149 mg/dL   HDL 37 (L) >91 mg/dL   VLDL Cholesterol Cal 38 5 - 40 mg/dL   LDL Chol Calc (NIH) 916 (H) 0 - 99 mg/dL   Chol/HDL Ratio 5.1 (H) 0.0 - 5.0 ratio  Comprehensive metabolic panel  Result Value Ref Range   Glucose 121 (H) 65 - 99 mg/dL   BUN 9 6 - 20 mg/dL   Creatinine, Ser 6.06 0.76 - 1.27 mg/dL   GFR calc non Af Amer 96 >59 mL/min/1.73   GFR calc Af Amer 110 >59 mL/min/1.73   BUN/Creatinine Ratio 9 9 - 20   Sodium 141 134 - 144 mmol/L   Potassium 4.3 3.5 - 5.2 mmol/L   Chloride 105 96 -  106 mmol/L   CO2 21 20 - 29 mmol/L   Calcium 9.2 8.7 - 10.2 mg/dL   Total Protein 7.0 6.0 - 8.5 g/dL   Albumin 4.2 4.0 - 5.0 g/dL   Globulin, Total 2.8 1.5 - 4.5 g/dL   Albumin/Globulin Ratio 1.5 1.2 - 2.2   Bilirubin Total <0.2 0.0 - 1.2 mg/dL   Alkaline Phosphatase 96 44 - 121 IU/L   AST 38 0 - 40 IU/L   ALT 38 0 - 44 IU/L      Assessment & Plan:   Problem List Items Addressed This Visit       Digestive   GERD (gastroesophageal reflux disease)     Other   Anxiety - Primary   Hyperlipidemia     Discussed aspirin prophylaxis for myocardial infarction prevention and decision was {Blank single:19197::"it was not indicated","made to continue ASA","made to start ASA","made to stop ASA","that we recommended ASA, and patient refused"}  LABORATORY TESTING:  Health maintenance labs ordered today as discussed above.   The natural history of prostate cancer and ongoing controversy regarding  screening and potential treatment outcomes of prostate cancer has been discussed with the patient. The meaning of a false positive PSA and a false negative PSA has been discussed. He indicates understanding of the limitations of this screening test and wishes *** to proceed with screening PSA testing.   IMMUNIZATIONS:   - Tdap: Tetanus vaccination status reviewed: {tetanus status:315746}. - Influenza: {Blank single:19197::"Up to date","Administered today","Postponed to flu season","Refused","Given elsewhere"} - Pneumovax: {Blank single:19197::"Up to date","Administered today","Not applicable","Refused","Given elsewhere"} - Prevnar: {Blank single:19197::"Up to date","Administered today","Not applicable","Refused","Given elsewhere"} - HPV: {Blank single:19197::"Up to date","Administered today","Not applicable","Refused","Given elsewhere"} - Zostavax vaccine: {Blank single:19197::"Up to date","Administered today","Not applicable","Refused","Given elsewhere"}  SCREENING: - Colonoscopy: {Blank single:19197::"Up to date","Ordered today","Not applicable","Refused","Done elsewhere"}  Discussed with patient purpose of the colonoscopy is to detect colon cancer at curable precancerous or early stages   - AAA Screening: {Blank single:19197::"Up to date","Ordered today","Not applicable","Refused","Done elsewhere"}  -Hearing Test: {Blank single:19197::"Up to date","Ordered today","Not applicable","Refused","Done elsewhere"}  -Spirometry: {Blank single:19197::"Up to date","Ordered today","Not applicable","Refused","Done elsewhere"}   PATIENT COUNSELING:    Sexuality: Discussed sexually transmitted diseases, partner selection, use of condoms, avoidance of unintended pregnancy  and contraceptive alternatives.   Advised to avoid cigarette smoking.  I discussed with the patient that most people either abstain from alcohol or drink within safe limits (<=14/week and <=4 drinks/occasion for males, <=7/weeks and  <= 3 drinks/occasion for females) and that the risk for alcohol disorders and other health effects rises proportionally with the number of drinks per week and how often a drinker exceeds daily limits.  Discussed cessation/primary prevention of drug use and availability of treatment for abuse.   Diet: Encouraged to adjust caloric intake to maintain  or achieve ideal body weight, to reduce intake of dietary saturated fat and total fat, to limit sodium intake by avoiding high sodium foods and not adding table salt, and to maintain adequate dietary potassium and calcium preferably from fresh fruits, vegetables, and low-fat dairy products.    stressed the importance of regular exercise  Injury prevention: Discussed safety belts, safety helmets, smoke detector, smoking near bedding or upholstery.   Dental health: Discussed importance of regular tooth brushing, flossing, and dental visits.   Follow up plan: NEXT PREVENTATIVE PHYSICAL DUE IN 1 YEAR. No follow-ups on file.

## 2021-03-17 ENCOUNTER — Encounter: Payer: 59 | Admitting: Nurse Practitioner

## 2021-04-19 ENCOUNTER — Ambulatory Visit (INDEPENDENT_AMBULATORY_CARE_PROVIDER_SITE_OTHER): Payer: 59 | Admitting: Nurse Practitioner

## 2021-04-19 ENCOUNTER — Encounter: Payer: Self-pay | Admitting: Nurse Practitioner

## 2021-04-19 ENCOUNTER — Other Ambulatory Visit: Payer: Self-pay

## 2021-04-19 VITALS — BP 135/86 | HR 78 | Temp 98.2°F | Ht 72.32 in | Wt 220.0 lb

## 2021-04-19 DIAGNOSIS — K219 Gastro-esophageal reflux disease without esophagitis: Secondary | ICD-10-CM

## 2021-04-19 DIAGNOSIS — E785 Hyperlipidemia, unspecified: Secondary | ICD-10-CM

## 2021-04-19 DIAGNOSIS — Z0000 Encounter for general adult medical examination without abnormal findings: Secondary | ICD-10-CM

## 2021-04-19 DIAGNOSIS — F419 Anxiety disorder, unspecified: Secondary | ICD-10-CM

## 2021-04-19 LAB — URINALYSIS, ROUTINE W REFLEX MICROSCOPIC
Bilirubin, UA: NEGATIVE
Glucose, UA: NEGATIVE
Ketones, UA: NEGATIVE
Leukocytes,UA: NEGATIVE
Nitrite, UA: NEGATIVE
RBC, UA: NEGATIVE
Specific Gravity, UA: >1.03 — ABNORMAL HIGH (ref 1.005–1.030)
Urobilinogen, Ur: 1 mg/dL (ref 0.2–1.0)
pH, UA: 5.5 (ref 5.0–7.5)

## 2021-04-19 LAB — MICROSCOPIC EXAMINATION
Epithelial Cells (non renal): NONE SEEN /hpf (ref 0–10)
WBC, UA: NONE SEEN /hpf (ref 0–5)

## 2021-04-19 MED ORDER — ATORVASTATIN CALCIUM 80 MG PO TABS: 80.0000 mg | ORAL_TABLET | Freq: Every day | ORAL | 1 refills | Status: DC

## 2021-04-19 MED ORDER — FLUOXETINE HCL 20 MG PO TABS: 20.0000 mg | ORAL_TABLET | Freq: Every day | ORAL | 1 refills | Status: DC

## 2021-04-19 MED ORDER — PANTOPRAZOLE SODIUM 40 MG PO TBEC: 40.0000 mg | DELAYED_RELEASE_TABLET | Freq: Every day | ORAL | 1 refills | Status: DC

## 2021-04-19 NOTE — Assessment & Plan Note (Signed)
Chronic.  Controlled.  Continue with current medication regimen of Atorvastatin 80mg  daily.  Labs ordered today. Refills sent today.  Return to clinic in 6 months for reevaluation.  Call sooner if concerns arise.

## 2021-04-19 NOTE — Progress Notes (Signed)
Hi Machi. Your urine from today looks good.  I will send you another message once the rest of your lab work comes back.

## 2021-04-19 NOTE — Assessment & Plan Note (Signed)
Chronic.  Controlled.  Continue with current medication regimen of Protonix 40mg  daily.  Labs ordered today.  Return to clinic in 6 months for reevaluation.  Call sooner if concerns arise.

## 2021-04-19 NOTE — Assessment & Plan Note (Signed)
Chronic.  Controlled.  Continue with current medication regimen of Prozac 20mg  daily.  Labs ordered today.  Refills sent today.  Return to clinic in 6 months for reevaluation.  Call sooner if concerns arise.

## 2021-04-19 NOTE — Progress Notes (Signed)
BP 135/86   Pulse 78   Temp 98.2 F (36.8 C)   Ht 6' 0.32" (1.837 m)   Wt 220 lb (99.8 kg)   SpO2 97%   BMI 29.57 kg/m    Subjective:    Patient ID: Ricky Nordmann., male    DOB: 1981/08/06, 40 y.o.   MRN: [id number]  HPI: Ricky Victorio. is a 40 y.o. male presenting on 04/19/2021 for comprehensive medical examination. Current medical complaints include:none  He currently lives with: Interim Problems from his last visit: no  HYPERLIPIDEMIA Hyperlipidemia status: excellent compliance Satisfied with current treatment?  no Side effects:  no Medication compliance: excellent compliance Past cholesterol meds: atorvastain (lipitor) Supplements: none Aspirin:  no The 10-year ASCVD risk score Denman George DC Jr., et al., 2013) is: 6.3%   Values used to calculate the score:     Age: 62 years     Sex: Male     Is Non-Hispanic African American: Yes     Diabetic: No     Tobacco smoker: Yes     Systolic Blood Pressure: 135 mmHg     Is BP treated: No     HDL Cholesterol: 37 mg/dL     Total Cholesterol: 190 mg/dL Chest pain:  no Coronary artery disease:  no Family history CAD:  no Family history early CAD:  no  ANXIETY Patient states the proxac is working well for him.  Denies concerns at visit today.  Denies SI.  GERD States the Protonix helps.  Patient does have reflux with red sauce, onions, and peppers. Denies needing to change medication today.    Denies HA, CP, SOB, dizziness, palpitations, visual changes, and lower extremity swelling.  Depression Screen done today and results listed below:  Depression screen [facility] 2/9 04/19/2021 09/17/2020 03/10/2020 09/10/2019 12/04/2018  Decreased Interest 0 0 0 0 0  Down, Depressed, Hopeless 0 0 0 0 0  PHQ - 2 Score 0 0 0 0 0  Altered sleeping 0 0 0 0 0  Tired, decreased energy 1 0 0 0 0  Change in appetite 0 0 0 0 0  Feeling bad or failure about yourself  0 0 0 0 0  Trouble concentrating 0 0 0 0 0  Moving slowly or fidgety/restless 0  0 0 0 0  Suicidal thoughts 0 0 0 0 0  PHQ-9 Score 1 0 0 0 0  Difficult doing work/chores Not difficult at all Not difficult at all - - Not difficult at all    The patient does not have a history of falls. I did complete a risk assessment for falls. A plan of care for falls was documented.   Past Medical History:  Past Medical History:  Diagnosis Date   Seasonal allergies     Surgical History:  History reviewed. No pertinent surgical history.  Medications:  Current Outpatient Medications on File Prior to Visit  Medication Sig   fluticasone (FLONASE) 50 MCG/ACT nasal spray Place 2 sprays into both nostrils 2 (two) times daily.   levocetirizine (XYZAL) 5 MG tablet Take 1 tablet (5 mg total) by mouth 2 (two) times daily.   montelukast (SINGULAIR) 10 MG tablet Take 1 tablet (10 mg total) by mouth at bedtime.   No current facility-administered medications on file prior to visit.    Allergies:  No Known Allergies  Social History:  Social History   Socioeconomic History   Marital status: Single    Spouse name: Not on file   Number of  children: Not on file   Years of education: Not on file   Highest education level: Not on file  Occupational History   Not on file  Tobacco Use   Smoking status: Every Day    Packs/day: 0.50    Types: Cigarettes   Smokeless tobacco: Never  Vaping Use   Vaping Use: Never used  Substance and Sexual Activity   Alcohol use: Yes    Alcohol/week: 2.0 standard drinks    Types: 2 Cans of beer per week    Comment: per day, liquor on the weekends   Drug use: No   Sexual activity: Not on file  Other Topics Concern   Not on file  Social History Narrative   Not on file   Social Determinants of Health   Financial Resource Strain: Not on file  Food Insecurity: Not on file  Transportation Needs: Not on file  Physical Activity: Not on file  Stress: Not on file  Social Connections: Not on file  Intimate Partner Violence: Not on file   Social  History   Tobacco Use  Smoking Status Every Day   Packs/day: 0.50   Types: Cigarettes  Smokeless Tobacco Never   Social History   Substance and Sexual Activity  Alcohol Use Yes   Alcohol/week: 2.0 standard drinks   Types: 2 Cans of beer per week   Comment: per day, liquor on the weekends    Family History:  Family History  Problem Relation Age of Onset   Cancer Mother     Past medical history, surgical history, medications, allergies, family history and social history reviewed with patient today and changes made to appropriate areas of the chart.   Review of Systems  Eyes:  Negative for blurred vision and double vision.  Respiratory:  Negative for shortness of breath.   Cardiovascular:  Negative for chest pain, palpitations and leg swelling.  Neurological:  Negative for dizziness and headaches.  Psychiatric/Behavioral:  Negative for suicidal ideas. The patient is not nervous/anxious.   All other ROS negative except what is listed above and in the HPI.      Objective:    BP 135/86   Pulse 78   Temp 98.2 F (36.8 C)   Ht 6' 0.32" (1.837 m)   Wt 220 lb (99.8 kg)   SpO2 97%   BMI 29.57 kg/m   Wt Readings from Last 3 Encounters:  04/19/21 220 lb (99.8 kg)  09/17/20 212 lb 6.4 oz (96.3 kg)  03/10/20 209 lb (94.8 kg)    Physical Exam Vitals and nursing note reviewed.  Constitutional:      General: He is not in acute distress.    Appearance: Normal appearance. He is not ill-appearing, toxic-appearing or diaphoretic.  HENT:     Head: Normocephalic.     Right Ear: Tympanic membrane, ear canal and external ear normal.     Left Ear: Tympanic membrane, ear canal and external ear normal.     Nose: Nose normal. No congestion or rhinorrhea.     Mouth/Throat:     Mouth: Mucous membranes are moist.  Eyes:     General:        Right eye: No discharge.        Left eye: No discharge.     Extraocular Movements: Extraocular movements intact.     Conjunctiva/sclera:  Conjunctivae normal.     Pupils: Pupils are equal, round, and reactive to light.  Cardiovascular:     Rate and Rhythm: Normal rate  and regular rhythm.     Heart sounds: No murmur heard. Pulmonary:     Effort: Pulmonary effort is normal. No respiratory distress.     Breath sounds: Normal breath sounds. No wheezing, rhonchi or rales.  Abdominal:     General: Abdomen is flat. Bowel sounds are normal. There is no distension.     Palpations: Abdomen is soft.     Tenderness: There is no abdominal tenderness. There is no guarding.  Musculoskeletal:     Cervical back: Normal range of motion and neck supple.  Skin:    General: Skin is warm and dry.     Capillary Refill: Capillary refill takes less than 2 seconds.  Neurological:     General: No focal deficit present.     Mental Status: He is alert and oriented to person, place, and time.     Cranial Nerves: No cranial nerve deficit.     Motor: No weakness.     Deep Tendon Reflexes: Reflexes normal.  Psychiatric:        Mood and Affect: Mood normal.        Behavior: Behavior normal.        Thought Content: Thought content normal.        Judgment: Judgment normal.    Results for orders placed or performed in visit on 09/17/20  HgB A1c  Result Value Ref Range   Hgb A1c MFr Bld 5.6 4.8 - 5.6 %   Est. average glucose Bld gHb Est-mCnc 114 mg/dL  Lipid Profile  Result Value Ref Range   Cholesterol, Total 190 100 - 199 mg/dL   Triglycerides 409216 (H) 0 - 149 mg/dL   HDL 37 (L) >81>39 mg/dL   VLDL Cholesterol Cal 38 5 - 40 mg/dL   LDL Chol Calc (NIH) 191115 (H) 0 - 99 mg/dL   Chol/HDL Ratio 5.1 (H) 0.0 - 5.0 ratio  Comprehensive metabolic panel  Result Value Ref Range   Glucose 121 (H) 65 - 99 mg/dL   BUN 9 6 - 20 mg/dL   Creatinine, Ser 4.780.99 0.76 - 1.27 mg/dL   GFR calc non Af Amer 96 >59 mL/min/1.73   GFR calc Af Amer 110 >59 mL/min/1.73   BUN/Creatinine Ratio 9 9 - 20   Sodium 141 134 - 144 mmol/L   Potassium 4.3 3.5 - 5.2 mmol/L    Chloride 105 96 - 106 mmol/L   CO2 21 20 - 29 mmol/L   Calcium 9.2 8.7 - 10.2 mg/dL   Total Protein 7.0 6.0 - 8.5 g/dL   Albumin 4.2 4.0 - 5.0 g/dL   Globulin, Total 2.8 1.5 - 4.5 g/dL   Albumin/Globulin Ratio 1.5 1.2 - 2.2   Bilirubin Total <0.2 0.0 - 1.2 mg/dL   Alkaline Phosphatase 96 44 - 121 IU/L   AST 38 0 - 40 IU/L   ALT 38 0 - 44 IU/L      Assessment & Plan:   Problem List Items Addressed This Visit       Digestive   GERD (gastroesophageal reflux disease)    Chronic.  Controlled.  Continue with current medication regimen of Protonix 40mg  daily.  Labs ordered today.  Return to clinic in 6 months for reevaluation.  Call sooner if concerns arise.        Relevant Medications   pantoprazole (PROTONIX) 40 MG tablet     Other   Anxiety    Chronic.  Controlled.  Continue with current medication regimen of Prozac 20mg  daily.  Labs ordered today.  Refills sent today.  Return to clinic in 6 months for reevaluation.  Call sooner if concerns arise.        Relevant Medications   FLUoxetine (PROZAC) 20 MG tablet   Hyperlipidemia    Chronic.  Controlled.  Continue with current medication regimen of Atorvastatin 80mg  daily.  Labs ordered today. Refills sent today.  Return to clinic in 6 months for reevaluation.  Call sooner if concerns arise.        Relevant Medications   atorvastatin (LIPITOR) 80 MG tablet   Other Relevant Orders   Lipid panel   Other Visit Diagnoses     Annual physical exam    -  Primary   Health maintenance reviewed during visit today.  Labs ordered today.  Refused Pneumovax.    Relevant Orders   TSH   Lipid panel   CBC with Differential/Platelet   Comprehensive metabolic panel   Urinalysis, Routine w reflex microscopic        Discussed aspirin prophylaxis for myocardial infarction prevention and decision was it was not indicated  LABORATORY TESTING:  Health maintenance labs ordered today as discussed above.    IMMUNIZATIONS:   - Tdap:  Tetanus vaccination status reviewed: last tetanus booster within 10 years. - Influenza: Postponed to flu season - Pneumovax: Refused - Prevnar: Not applicable - HPV: Not applicable - Zostavax vaccine: Not applicable  SCREENING: - Colonoscopy: Not applicable  Discussed with patient purpose of the colonoscopy is to detect colon cancer at curable precancerous or early stages   - AAA Screening: Not applicable  -Hearing Test: Not applicable  -Spirometry: Not applicable   PATIENT COUNSELING:    Sexuality: Discussed sexually transmitted diseases, partner selection, use of condoms, avoidance of unintended pregnancy  and contraceptive alternatives.   Advised to avoid cigarette smoking.  I discussed with the patient that most people either abstain from alcohol or drink within safe limits (<=14/week and <=4 drinks/occasion for males, <=7/weeks and <= 3 drinks/occasion for females) and that the risk for alcohol disorders and other health effects rises proportionally with the number of drinks per week and how often a drinker exceeds daily limits.  Discussed cessation/primary prevention of drug use and availability of treatment for abuse.   Diet: Encouraged to adjust caloric intake to maintain  or achieve ideal body weight, to reduce intake of dietary saturated fat and total fat, to limit sodium intake by avoiding high sodium foods and not adding table salt, and to maintain adequate dietary potassium and calcium preferably from fresh fruits, vegetables, and low-fat dairy products.    stressed the importance of regular exercise  Injury prevention: Discussed safety belts, safety helmets, smoke detector, smoking near bedding or upholstery.   Dental health: Discussed importance of regular tooth brushing, flossing, and dental visits.   Follow up plan: NEXT PREVENTATIVE PHYSICAL DUE IN 1 YEAR. Return in about 6 months (around 10/18/2021) for HTN, HLD, DM2 FU.

## 2021-04-20 LAB — COMPREHENSIVE METABOLIC PANEL
ALT: 51 IU/L — ABNORMAL HIGH (ref 0–44)
AST: 41 IU/L — ABNORMAL HIGH (ref 0–40)
Albumin/Globulin Ratio: 1.5 (ref 1.2–2.2)
Albumin: 4.5 g/dL (ref 4.0–5.0)
Alkaline Phosphatase: 103 IU/L (ref 44–121)
BUN/Creatinine Ratio: 11 (ref 9–20)
BUN: 11 mg/dL (ref 6–24)
Bilirubin Total: 0.4 mg/dL (ref 0.0–1.2)
CO2: 22 mmol/L (ref 20–29)
Calcium: 9.3 mg/dL (ref 8.7–10.2)
Chloride: 103 mmol/L (ref 96–106)
Creatinine, Ser: 0.98 mg/dL (ref 0.76–1.27)
Globulin, Total: 3 g/dL (ref 1.5–4.5)
Glucose: 96 mg/dL (ref 65–99)
Potassium: 4.2 mmol/L (ref 3.5–5.2)
Sodium: 139 mmol/L (ref 134–144)
Total Protein: 7.5 g/dL (ref 6.0–8.5)
eGFR: 100 mL/min/{1.73_m2} (ref >59)

## 2021-04-20 LAB — TSH: TSH: 1.62 u[IU]/mL (ref 0.450–4.500)

## 2021-04-20 LAB — CBC WITH DIFFERENTIAL/PLATELET
Basophils Absolute: 0.1 10*3/uL (ref 0.0–0.2)
Basos: 1 %
EOS (ABSOLUTE): 0.3 10*3/uL (ref 0.0–0.4)
Eos: 5 %
Hematocrit: 45.7 % (ref 37.5–51.0)
Hemoglobin: 14.1 g/dL (ref 13.0–17.7)
Immature Grans (Abs): 0 10*3/uL (ref 0.0–0.1)
Immature Granulocytes: 0 %
Lymphocytes Absolute: 2.7 10*3/uL (ref 0.7–3.1)
Lymphs: 38 %
MCH: 23.2 pg — ABNORMAL LOW (ref 26.6–33.0)
MCHC: 30.9 g/dL — ABNORMAL LOW (ref 31.5–35.7)
MCV: 75 fL — ABNORMAL LOW (ref 79–97)
Monocytes Absolute: 1 10*3/uL — ABNORMAL HIGH (ref 0.1–0.9)
Monocytes: 14 %
Neutrophils Absolute: 3 10*3/uL (ref 1.4–7.0)
Neutrophils: 42 %
Platelets: 284 10*3/uL (ref 150–450)
RBC: 6.07 x10E6/uL — ABNORMAL HIGH (ref 4.14–5.80)
RDW: 17.5 % — ABNORMAL HIGH (ref 11.6–15.4)
WBC: 7.2 10*3/uL (ref 3.4–10.8)

## 2021-04-20 LAB — LIPID PANEL
Chol/HDL Ratio: 4.3 ratio (ref 0.0–5.0)
Cholesterol, Total: 182 mg/dL (ref 100–199)
HDL: 42 mg/dL (ref >39)
LDL Chol Calc (NIH): 116 mg/dL — ABNORMAL HIGH (ref 0–99)
Triglycerides: 135 mg/dL (ref 0–149)
VLDL Cholesterol Cal: 24 mg/dL (ref 5–40)

## 2021-04-20 NOTE — Progress Notes (Signed)
Hi Ricky Anderson.  Your lab work shows that your cholesterol has improved from prior.  Your blood is thicker than we would like, this is likely due to smoking.  We will continue to monitor this in the future.  Your liver enzymes are elevated.  I recommend following a low fat diet, avoiding alcohol and tylenol.  Please let me know if you have any questions.  We will continue to monitor these in the future.

## 2021-10-06 ENCOUNTER — Encounter: Payer: Self-pay | Admitting: Nurse Practitioner

## 2021-10-06 MED ORDER — FLUTICASONE PROPIONATE 50 MCG/ACT NA SUSP: 2.0000 | Freq: Two times a day (BID) | NASAL | 11 refills | Status: DC

## 2021-10-06 MED ORDER — LEVOCETIRIZINE DIHYDROCHLORIDE 5 MG PO TABS: 5.0000 mg | ORAL_TABLET | Freq: Two times a day (BID) | ORAL | 11 refills | Status: DC

## 2021-10-06 MED ORDER — MONTELUKAST SODIUM 10 MG PO TABS: 10.0000 mg | ORAL_TABLET | Freq: Every day | ORAL | 3 refills | Status: DC

## 2021-10-17 NOTE — Progress Notes (Signed)
BP 130/77    Pulse 81    Temp 98.7 F (37.1 C) (Oral)    Wt 210 lb (95.3 kg)    SpO2 95%    BMI 28.23 kg/m    Subjective:    Patient ID: Ricky Shire., male    DOB: April 11, 1981, 41 y.o.   MRN: [id number]  HPI: Ricky Kloepfer. is a 41 y.o. male  Chief Complaint  Patient presents with   Hypertension   Hyperlipidemia   HYPERLIPIDEMIA Hyperlipidemia status: excellent compliance Satisfied with current treatment?  yes Side effects:  no Medication compliance: excellent compliance Past cholesterol meds: Lipitor Supplements: none Aspirin:  no The 10-year ASCVD risk score (Arnett DK, et al., 2019) is: 5.9%   Values used to calculate the score:     Age: 68 years     Sex: Male     Is Non-Hispanic African American: Yes     Diabetic: No     Tobacco smoker: Yes     Systolic Blood Pressure: 384 mmHg     Is BP treated: No     HDL Cholesterol: 42 mg/dL     Total Cholesterol: 182 mg/dL Chest pain:  no Coronary artery disease:  no Family history CAD:  no Family history early CAD:  no   ANXIETY/STRESS Duration:stable Anxious mood: no  Excessive worrying: no Irritability: no  Sweating: no Nausea: no Palpitations:no Hyperventilation: no Panic attacks: no Agoraphobia: no  Obscessions/compulsions: no Depressed mood: no Depression screen [facility] 2/9 10/18/2021 04/19/2021 09/17/2020 03/10/2020 09/10/2019  Decreased Interest 0 0 0 0 0  Down, Depressed, Hopeless 0 0 0 0 0  PHQ - 2 Score 0 0 0 0 0  Altered sleeping 0 0 0 0 0  Tired, decreased energy 0 1 0 0 0  Change in appetite 0 0 0 0 0  Feeling bad or failure about yourself  0 0 0 0 0  Trouble concentrating 0 0 0 0 0  Moving slowly or fidgety/restless 0 0 0 0 0  Suicidal thoughts 0 0 0 0 0  PHQ-9 Score 0 1 0 0 0  Difficult doing work/chores Not difficult at all Not difficult at all Not difficult at all - -   Anhedonia: no Weight changes: no Insomnia: no  Hypersomnia: no Fatigue/loss of energy: no Feelings of  worthlessness: no Feelings of guilt: no Impaired concentration/indecisiveness: no Suicidal ideations: no  Crying spells: no Recent Stressors/Life Changes: no   Relationship problems: no   Family stress: no     Financial stress: no    Job stress: no    Recent death/loss: no  Patient states his right knee has been bothering him for the last couple of months.  States the pain is intermittent.  Feels like it is popping out of place at times. Hurts if he moves a certain way at times.    Relevant past medical, surgical, family and social history reviewed and updated as indicated. Interim medical history since our last visit reviewed. Allergies and medications reviewed and updated.  Review of Systems  HENT:         Dental abscess  Eyes:  Negative for visual disturbance.  Respiratory:  Negative for chest tightness and shortness of breath.   Cardiovascular:  Negative for chest pain, palpitations and leg swelling.  Musculoskeletal:        Right knee pain  Neurological:  Negative for dizziness, light-headedness and headaches.  Psychiatric/Behavioral:  Negative for dysphoric mood. The patient is not nervous/anxious.  Per HPI unless specifically indicated above     Objective:    BP 130/77    Pulse 81    Temp 98.7 F (37.1 C) (Oral)    Wt 210 lb (95.3 kg)    SpO2 95%    BMI 28.23 kg/m   Wt Readings from Last 3 Encounters:  10/18/21 210 lb (95.3 kg)  04/19/21 220 lb (99.8 kg)  09/17/20 212 lb 6.4 oz (96.3 kg)    Physical Exam Vitals and nursing note reviewed.  Constitutional:      General: He is not in acute distress.    Appearance: Normal appearance. He is not ill-appearing, toxic-appearing or diaphoretic.  HENT:     Head: Normocephalic.     Right Ear: External ear normal.     Left Ear: External ear normal.     Nose: Nose normal. No congestion or rhinorrhea.     Mouth/Throat:     Mouth: Mucous membranes are moist.     Dentition: Abnormal dentition. Dental tenderness and  dental abscesses present.  Eyes:     General:        Right eye: No discharge.        Left eye: No discharge.     Extraocular Movements: Extraocular movements intact.     Conjunctiva/sclera: Conjunctivae normal.     Pupils: Pupils are equal, round, and reactive to light.  Cardiovascular:     Rate and Rhythm: Normal rate and regular rhythm.     Heart sounds: No murmur heard. Pulmonary:     Effort: Pulmonary effort is normal. No respiratory distress.     Breath sounds: Normal breath sounds. No wheezing, rhonchi or rales.  Abdominal:     General: Abdomen is flat. Bowel sounds are normal.  Musculoskeletal:     Cervical back: Normal range of motion and neck supple.     Right knee: Tenderness present.  Skin:    General: Skin is warm and dry.     Capillary Refill: Capillary refill takes less than 2 seconds.  Neurological:     General: No focal deficit present.     Mental Status: He is alert and oriented to person, place, and time.  Psychiatric:        Mood and Affect: Mood normal.        Behavior: Behavior normal.        Thought Content: Thought content normal.        Judgment: Judgment normal.    Results for orders placed or performed in visit on 04/19/21  Microscopic Examination   Urine  Result Value Ref Range   WBC, UA None seen 0 - 5 /hpf   RBC 0-2 0 - 2 /hpf   Epithelial Cells (non renal) None seen 0 - 10 /hpf   Crystals Present (A) N/A   Crystal Type Calcium Oxalate N/A   Mucus, UA Present (A) Not Estab.   Bacteria, UA Few (A) None seen/Few  TSH  Result Value Ref Range   TSH 1.620 0.450 - 4.500 uIU/mL  Lipid panel  Result Value Ref Range   Cholesterol, Total 182 100 - 199 mg/dL   Triglycerides 135 0 - 149 mg/dL   HDL 42 >39 mg/dL   VLDL Cholesterol Cal 24 5 - 40 mg/dL   LDL Chol Calc (NIH) 116 (H) 0 - 99 mg/dL   Chol/HDL Ratio 4.3 0.0 - 5.0 ratio  CBC with Differential/Platelet  Result Value Ref Range   WBC 7.2 3.4 - 10.8 x10E3/uL  RBC 6.07 (H) 4.14 - 5.80  x10E6/uL   Hemoglobin 14.1 13.0 - 17.7 g/dL   Hematocrit 45.7 37.5 - 51.0 %   MCV 75 (L) 79 - 97 fL   MCH 23.2 (L) 26.6 - 33.0 pg   MCHC 30.9 (L) 31.5 - 35.7 g/dL   RDW 17.5 (H) 11.6 - 15.4 %   Platelets 284 150 - 450 x10E3/uL   Neutrophils 42 Not Estab. %   Lymphs 38 Not Estab. %   Monocytes 14 Not Estab. %   Eos 5 Not Estab. %   Basos 1 Not Estab. %   Neutrophils Absolute 3.0 1.4 - 7.0 x10E3/uL   Lymphocytes Absolute 2.7 0.7 - 3.1 x10E3/uL   Monocytes Absolute 1.0 (H) 0.1 - 0.9 x10E3/uL   EOS (ABSOLUTE) 0.3 0.0 - 0.4 x10E3/uL   Basophils Absolute 0.1 0.0 - 0.2 x10E3/uL   Immature Granulocytes 0 Not Estab. %   Immature Grans (Abs) 0.0 0.0 - 0.1 x10E3/uL  Comprehensive metabolic panel  Result Value Ref Range   Glucose 96 65 - 99 mg/dL   BUN 11 6 - 24 mg/dL   Creatinine, Ser 0.98 0.76 - 1.27 mg/dL   eGFR 100 >59 mL/min/1.73   BUN/Creatinine Ratio 11 9 - 20   Sodium 139 134 - 144 mmol/L   Potassium 4.2 3.5 - 5.2 mmol/L   Chloride 103 96 - 106 mmol/L   CO2 22 20 - 29 mmol/L   Calcium 9.3 8.7 - 10.2 mg/dL   Total Protein 7.5 6.0 - 8.5 g/dL   Albumin 4.5 4.0 - 5.0 g/dL   Globulin, Total 3.0 1.5 - 4.5 g/dL   Albumin/Globulin Ratio 1.5 1.2 - 2.2   Bilirubin Total 0.4 0.0 - 1.2 mg/dL   Alkaline Phosphatase 103 44 - 121 IU/L   AST 41 (H) 0 - 40 IU/L   ALT 51 (H) 0 - 44 IU/L  Urinalysis, Routine w reflex microscopic  Result Value Ref Range   Specific Gravity, UA >1.030 (H) 1.005 - 1.030   pH, UA 5.5 5.0 - 7.5   Color, UA Yellow Yellow   Appearance Ur Clear Clear   Leukocytes,UA Negative Negative   Protein,UA 2+ (A) Negative/Trace   Glucose, UA Negative Negative   Ketones, UA Negative Negative   RBC, UA Negative Negative   Bilirubin, UA Negative Negative   Urobilinogen, Ur 1.0 0.2 - 1.0 mg/dL   Nitrite, UA Negative Negative   Microscopic Examination See below:       Assessment & Plan:   Problem List Items Addressed This Visit       Other   Anxiety - Primary     Chronic.  Controlled.  Continue with current medication regimen of Prozac 68m daily.  Refills sent today.  Labs ordered today.  Return to clinic in 6 months for reevaluation.  Call sooner if concerns arise.        Relevant Medications   FLUoxetine (PROZAC) 20 MG tablet   Other Relevant Orders   Comp Met (CMET)   Hyperlipidemia    Chronic.  Controlled.  Continue with current medication regimen of Atorvastatin 826mdaily.  Refills sent today.  Labs ordered today.  Return to clinic in 6 months for reevaluation.  Call sooner if concerns arise.        Relevant Medications   atorvastatin (LIPITOR) 80 MG tablet   Other Relevant Orders   Comp Met (CMET)   Lipid Profile   Other Visit Diagnoses     Abscess, dental  Will treat with amoxicillin.  Recommend patient see dentist for follow up.   Chronic pain of left knee       Will obtain xray of knee.  Will make recommendations based on imaging results.   Relevant Medications   FLUoxetine (PROZAC) 20 MG tablet   Other Relevant Orders   DG Knee Complete 4 Views Left        Follow up plan: Return in about 6 months (around 04/17/2022) for Physical and Fasting labs.

## 2021-10-18 ENCOUNTER — Other Ambulatory Visit: Payer: Self-pay

## 2021-10-18 ENCOUNTER — Ambulatory Visit: Payer: 59 | Admitting: Nurse Practitioner

## 2021-10-18 ENCOUNTER — Encounter: Payer: Self-pay | Admitting: Nurse Practitioner

## 2021-10-18 VITALS — BP 130/77 | HR 81 | Temp 98.7°F | Wt 210.0 lb

## 2021-10-18 DIAGNOSIS — G8929 Other chronic pain: Secondary | ICD-10-CM

## 2021-10-18 DIAGNOSIS — M25562 Pain in left knee: Secondary | ICD-10-CM

## 2021-10-18 DIAGNOSIS — K047 Periapical abscess without sinus: Secondary | ICD-10-CM

## 2021-10-18 DIAGNOSIS — F419 Anxiety disorder, unspecified: Secondary | ICD-10-CM | POA: Diagnosis not present

## 2021-10-18 DIAGNOSIS — E785 Hyperlipidemia, unspecified: Secondary | ICD-10-CM

## 2021-10-18 MED ORDER — AMOXICILLIN 500 MG PO CAPS: 500.0000 mg | ORAL_CAPSULE | Freq: Two times a day (BID) | ORAL | 0 refills | Status: AC

## 2021-10-18 MED ORDER — FLUOXETINE HCL 20 MG PO TABS: 20.0000 mg | ORAL_TABLET | Freq: Every day | ORAL | 1 refills | Status: DC

## 2021-10-18 MED ORDER — ATORVASTATIN CALCIUM 80 MG PO TABS: 80.0000 mg | ORAL_TABLET | Freq: Every day | ORAL | 1 refills | Status: DC

## 2021-10-18 MED ORDER — PANTOPRAZOLE SODIUM 40 MG PO TBEC: 40.0000 mg | DELAYED_RELEASE_TABLET | Freq: Every day | ORAL | 1 refills | Status: DC

## 2021-10-18 NOTE — Assessment & Plan Note (Signed)
Chronic.  Controlled.  Continue with current medication regimen of Prozac 20mg  daily.  Refills sent today.  Labs ordered today.  Return to clinic in 6 months for reevaluation.  Call sooner if concerns arise.

## 2021-10-18 NOTE — Assessment & Plan Note (Signed)
Chronic.  Controlled.  Continue with current medication regimen of Atorvastatin 80mg daily. Refills sent today.  Labs ordered today.  Return to clinic in 6 months for reevaluation.  Call sooner if concerns arise.   

## 2021-10-19 LAB — COMPREHENSIVE METABOLIC PANEL
ALT: 36 IU/L (ref 0–44)
AST: 36 IU/L (ref 0–40)
Albumin/Globulin Ratio: 1.5 (ref 1.2–2.2)
Albumin: 4.5 g/dL (ref 4.0–5.0)
Alkaline Phosphatase: 104 IU/L (ref 44–121)
BUN/Creatinine Ratio: 9 (ref 9–20)
BUN: 8 mg/dL (ref 6–24)
Bilirubin Total: 0.3 mg/dL (ref 0.0–1.2)
CO2: 22 mmol/L (ref 20–29)
Calcium: 9.4 mg/dL (ref 8.7–10.2)
Chloride: 104 mmol/L (ref 96–106)
Creatinine, Ser: 0.92 mg/dL (ref 0.76–1.27)
Globulin, Total: 3 g/dL (ref 1.5–4.5)
Glucose: 96 mg/dL (ref 70–99)
Potassium: 4.4 mmol/L (ref 3.5–5.2)
Sodium: 141 mmol/L (ref 134–144)
Total Protein: 7.5 g/dL (ref 6.0–8.5)
eGFR: 107 mL/min/{1.73_m2} (ref >59)

## 2021-10-19 LAB — LIPID PANEL
Chol/HDL Ratio: 4.5 ratio (ref 0.0–5.0)
Cholesterol, Total: 205 mg/dL — ABNORMAL HIGH (ref 100–199)
HDL: 46 mg/dL (ref >39)
LDL Chol Calc (NIH): 135 mg/dL — ABNORMAL HIGH (ref 0–99)
Triglycerides: 133 mg/dL (ref 0–149)
VLDL Cholesterol Cal: 24 mg/dL (ref 5–40)

## 2021-10-19 NOTE — Progress Notes (Signed)
Hi Ricky Anderson. It was nice to see you yesterday.  Your lab work looks good.  Your cholesterol is slightly elevated from prior.  I recommend a low fat diet.  Continue with your current medication regimen.  Follow up as discussed.  Please let me know if you have any questions.

## 2022-04-17 ENCOUNTER — Encounter: Payer: 59 | Admitting: Nurse Practitioner

## 2022-05-11 NOTE — Progress Notes (Signed)
BP 118/78   Pulse 74   Temp 98.2 F (36.8 C) (Oral)   Ht 6' 0.24" (1.835 m)   Wt 208 lb 6.4 oz (94.5 kg)   SpO2 98%   BMI 28.07 kg/m    Subjective:    Patient ID: Ricky Shire., male    DOB: Apr 08, 1981, 41 y.o.   MRN: [id number]  HPI: Ricky Anderson. is a 41 y.o. male presenting on 05/15/2022 for comprehensive medical examination. Current medical complaints include:none  He currently lives with: Interim Problems from his last visit: no  HYPERLIPIDEMIA Hyperlipidemia status: excellent compliance Satisfied with current treatment?  yes Side effects:  no Medication compliance: excellent compliance Past cholesterol meds: Atorvastatin Supplements: none Aspirin:  no The 10-year ASCVD risk score (Arnett DK, et al., 2019) is: 5%   Values used to calculate the score:     Age: 62 years     Sex: Male     Is Non-Hispanic African American: Yes     Diabetic: No     Tobacco smoker: Yes     Systolic Blood Pressure: 048 mmHg     Is BP treated: No     HDL Cholesterol: 46 mg/dL     Total Cholesterol: 205 mg/dL Chest pain:  no Coronary artery disease:  no Family history CAD:  no Family history early CAD:  no  ANXIETY Patient states he is doing well.  Denies concerns at visit today.  Feels like the Fluoxetine is working well for him.  Depression Screen done today and results listed below:     05/15/2022    8:34 AM 10/18/2021    8:15 AM 04/19/2021    8:01 AM 09/17/2020    3:52 PM 03/10/2020    2:29 PM  Depression screen PHQ 2/9  Decreased Interest 0 0 0 0 0  Down, Depressed, Hopeless 0 0 0 0 0  PHQ - 2 Score 0 0 0 0 0  Altered sleeping 0 0 0 0 0  Tired, decreased energy 0 0 1 0 0  Change in appetite 0 0 0 0 0  Feeling bad or failure about yourself  0 0 0 0 0  Trouble concentrating 0 0 0 0 0  Moving slowly or fidgety/restless 0 0 0 0 0  Suicidal thoughts 0 0 0 0 0  PHQ-9 Score 0 0 1 0 0  Difficult doing work/chores Not difficult at all Not difficult at all Not difficult  at all Not difficult at all     The patient does not have a history of falls. I did complete a risk assessment for falls. A plan of care for falls was documented.   Past Medical History:  Past Medical History:  Diagnosis Date   Seasonal allergies     Surgical History:  History reviewed. No pertinent surgical history.  Medications:  Current Outpatient Medications on File Prior to Visit  Medication Sig   fluticasone (FLONASE) 50 MCG/ACT nasal spray Place 2 sprays into both nostrils 2 (two) times daily.   levocetirizine (XYZAL) 5 MG tablet Take 1 tablet (5 mg total) by mouth 2 (two) times daily.   No current facility-administered medications on file prior to visit.    Allergies:  No Known Allergies  Social History:  Social History   Socioeconomic History   Marital status: Single    Spouse name: Not on file   Number of children: Not on file   Years of education: Not on file   Highest education level: Not  on file  Occupational History   Not on file  Tobacco Use   Smoking status: Every Day    Packs/day: 0.50    Types: Cigarettes   Smokeless tobacco: Never  Vaping Use   Vaping Use: Never used  Substance and Sexual Activity   Alcohol use: Yes    Alcohol/week: 2.0 standard drinks of alcohol    Types: 2 Cans of beer per week    Comment: per day, liquor on the weekends   Drug use: No   Sexual activity: Yes  Other Topics Concern   Not on file  Social History Narrative   Not on file   Social Determinants of Health   Financial Resource Strain: Not on file  Food Insecurity: Not on file  Transportation Needs: Not on file  Physical Activity: Not on file  Stress: Not on file  Social Connections: Not on file  Intimate Partner Violence: Not on file   Social History   Tobacco Use  Smoking Status Every Day   Packs/day: 0.50   Types: Cigarettes  Smokeless Tobacco Never   Social History   Substance and Sexual Activity  Alcohol Use Yes   Alcohol/week: 2.0  standard drinks of alcohol   Types: 2 Cans of beer per week   Comment: per day, liquor on the weekends    Family History:  Family History  Problem Relation Age of Onset   Cancer Mother    Heart disease Father    Kidney disease Maternal Grandmother     Past medical history, surgical history, medications, allergies, family history and social history reviewed with patient today and changes made to appropriate areas of the chart.   ROS All other ROS negative except what is listed above and in the HPI.      Objective:    BP 118/78   Pulse 74   Temp 98.2 F (36.8 C) (Oral)   Ht 6' 0.24" (1.835 m)   Wt 208 lb 6.4 oz (94.5 kg)   SpO2 98%   BMI 28.07 kg/m   Wt Readings from Last 3 Encounters:  05/15/22 208 lb 6.4 oz (94.5 kg)  10/18/21 210 lb (95.3 kg)  04/19/21 220 lb (99.8 kg)    Physical Exam Vitals and nursing note reviewed.  Constitutional:      General: He is not in acute distress.    Appearance: Normal appearance. He is normal weight. He is not ill-appearing, toxic-appearing or diaphoretic.  HENT:     Head: Normocephalic.     Right Ear: Tympanic membrane, ear canal and external ear normal.     Left Ear: Tympanic membrane, ear canal and external ear normal.     Nose: Nose normal. No congestion or rhinorrhea.     Mouth/Throat:     Mouth: Mucous membranes are moist.  Eyes:     General:        Right eye: No discharge.        Left eye: No discharge.     Extraocular Movements: Extraocular movements intact.     Conjunctiva/sclera: Conjunctivae normal.     Pupils: Pupils are equal, round, and reactive to light.  Cardiovascular:     Rate and Rhythm: Normal rate and regular rhythm.     Heart sounds: No murmur heard. Pulmonary:     Effort: Pulmonary effort is normal. No respiratory distress.     Breath sounds: Normal breath sounds. No wheezing, rhonchi or rales.  Abdominal:     General: Abdomen is flat. Bowel sounds  are normal. There is no distension.     Palpations:  Abdomen is soft.     Tenderness: There is no abdominal tenderness. There is no guarding.  Musculoskeletal:     Cervical back: Normal range of motion and neck supple.  Skin:    General: Skin is warm and dry.     Capillary Refill: Capillary refill takes less than 2 seconds.  Neurological:     General: No focal deficit present.     Mental Status: He is alert and oriented to person, place, and time.     Cranial Nerves: No cranial nerve deficit.     Motor: No weakness.     Deep Tendon Reflexes: Reflexes normal.  Psychiatric:        Mood and Affect: Mood normal.        Behavior: Behavior normal.        Thought Content: Thought content normal.        Judgment: Judgment normal.     Results for orders placed or performed in visit on 10/18/21  Comp Met (CMET)  Result Value Ref Range   Glucose 96 70 - 99 mg/dL   BUN 8 6 - 24 mg/dL   Creatinine, Ser 0.92 0.76 - 1.27 mg/dL   eGFR 107 >59 mL/min/1.73   BUN/Creatinine Ratio 9 9 - 20   Sodium 141 134 - 144 mmol/L   Potassium 4.4 3.5 - 5.2 mmol/L   Chloride 104 96 - 106 mmol/L   CO2 22 20 - 29 mmol/L   Calcium 9.4 8.7 - 10.2 mg/dL   Total Protein 7.5 6.0 - 8.5 g/dL   Albumin 4.5 4.0 - 5.0 g/dL   Globulin, Total 3.0 1.5 - 4.5 g/dL   Albumin/Globulin Ratio 1.5 1.2 - 2.2   Bilirubin Total 0.3 0.0 - 1.2 mg/dL   Alkaline Phosphatase 104 44 - 121 IU/L   AST 36 0 - 40 IU/L   ALT 36 0 - 44 IU/L  Lipid Profile  Result Value Ref Range   Cholesterol, Total 205 (H) 100 - 199 mg/dL   Triglycerides 133 0 - 149 mg/dL   HDL 46 >39 mg/dL   VLDL Cholesterol Cal 24 5 - 40 mg/dL   LDL Chol Calc (NIH) 135 (H) 0 - 99 mg/dL   Chol/HDL Ratio 4.5 0.0 - 5.0 ratio      Assessment & Plan:   Problem List Items Addressed This Visit       Other   Anxiety    Chronic.  Controlled.  Continue with current medication regimen of Fluoxetine 32m.  Refills sent today.  Labs ordered today.  Return to clinic in 6 months for reevaluation.  Call sooner if concerns  arise.        Relevant Medications   FLUoxetine (PROZAC) 20 MG tablet   Hyperlipidemia    Chronic.  Controlled.  Continue with current medication regimen of Atorvastatin 844m  Refills sent today.  Labs ordered today.  Return to clinic in 6 months for reevaluation.  Call sooner if concerns arise.        Relevant Medications   atorvastatin (LIPITOR) 80 MG tablet   Other Relevant Orders   Lipid panel   Other Visit Diagnoses     Annual physical exam    -  Primary   Health maintenance reviewed during visit today.  Labs ordered. TDAP up todate. Declined Flu.   Relevant Orders   TSH   Lipid panel   CBC with Differential/Platelet   Comprehensive  metabolic panel   Urinalysis, Routine w reflex microscopic        Discussed aspirin prophylaxis for myocardial infarction prevention and decision was it was not indicated  LABORATORY TESTING:  Health maintenance labs ordered today as discussed above.    IMMUNIZATIONS:   - Tdap: Tetanus vaccination status reviewed: last tetanus booster within 10 years. - Influenza: Refused - Pneumovax: Not applicable - Prevnar: Not applicable - COVID: Not applicable - HPV: Not applicable - Shingrix vaccine: Not applicable  SCREENING: - Colonoscopy: Not applicable  Discussed with patient purpose of the colonoscopy is to detect colon cancer at curable precancerous or early stages   - AAA Screening: Not applicable  -Hearing Test: Not applicable  -Spirometry: Not applicable   PATIENT COUNSELING:    Sexuality: Discussed sexually transmitted diseases, partner selection, use of condoms, avoidance of unintended pregnancy  and contraceptive alternatives.   Advised to avoid cigarette smoking.  I discussed with the patient that most people either abstain from alcohol or drink within safe limits (<=14/week and <=4 drinks/occasion for males, <=7/weeks and <= 3 drinks/occasion for females) and that the risk for alcohol disorders and other health effects  rises proportionally with the number of drinks per week and how often a drinker exceeds daily limits.  Discussed cessation/primary prevention of drug use and availability of treatment for abuse.   Diet: Encouraged to adjust caloric intake to maintain  or achieve ideal body weight, to reduce intake of dietary saturated fat and total fat, to limit sodium intake by avoiding high sodium foods and not adding table salt, and to maintain adequate dietary potassium and calcium preferably from fresh fruits, vegetables, and low-fat dairy products.    stressed the importance of regular exercise  Injury prevention: Discussed safety belts, safety helmets, smoke detector, smoking near bedding or upholstery.   Dental health: Discussed importance of regular tooth brushing, flossing, and dental visits.   Follow up plan: NEXT PREVENTATIVE PHYSICAL DUE IN 1 YEAR. Return in about 6 months (around 11/13/2022) for HTN, HLD, DM2 FU.

## 2022-05-15 ENCOUNTER — Ambulatory Visit (INDEPENDENT_AMBULATORY_CARE_PROVIDER_SITE_OTHER): Payer: 59 | Admitting: Nurse Practitioner

## 2022-05-15 ENCOUNTER — Ambulatory Visit
Admission: RE | Admit: 2022-05-15 | Discharge: 2022-05-15 | Disposition: A | Discharge status: Home / Self Care | Payer: 59 | Attending: Nurse Practitioner | Admitting: Nurse Practitioner

## 2022-05-15 ENCOUNTER — Encounter: Payer: Self-pay | Admitting: Nurse Practitioner

## 2022-05-15 ENCOUNTER — Ambulatory Visit
Admission: RE | Admit: 2022-05-15 | Discharge: 2022-05-15 | Disposition: A | Discharge status: Home / Self Care | Payer: 59 | Source: Ambulatory Visit | Attending: Nurse Practitioner | Admitting: Nurse Practitioner

## 2022-05-15 VITALS — BP 118/78 | HR 74 | Temp 98.2°F | Ht 72.24 in | Wt 208.4 lb

## 2022-05-15 DIAGNOSIS — G8929 Other chronic pain: Secondary | ICD-10-CM

## 2022-05-15 DIAGNOSIS — M25562 Pain in left knee: Secondary | ICD-10-CM | POA: Diagnosis not present

## 2022-05-15 DIAGNOSIS — E785 Hyperlipidemia, unspecified: Secondary | ICD-10-CM | POA: Diagnosis not present

## 2022-05-15 DIAGNOSIS — Z0000 Encounter for general adult medical examination without abnormal findings: Secondary | ICD-10-CM

## 2022-05-15 DIAGNOSIS — F419 Anxiety disorder, unspecified: Secondary | ICD-10-CM | POA: Diagnosis not present

## 2022-05-15 LAB — MICROSCOPIC EXAMINATION
Bacteria, UA: NONE SEEN
Epithelial Cells (non renal): NONE SEEN /hpf (ref 0–10)

## 2022-05-15 LAB — URINALYSIS, ROUTINE W REFLEX MICROSCOPIC
Bilirubin, UA: NEGATIVE
Glucose, UA: NEGATIVE
Ketones, UA: NEGATIVE
Leukocytes,UA: NEGATIVE
Nitrite, UA: NEGATIVE
Protein,UA: NEGATIVE
Specific Gravity, UA: >1.03 — ABNORMAL HIGH (ref 1.005–1.030)
Urobilinogen, Ur: 0.2 mg/dL (ref 0.2–1.0)
pH, UA: 6 (ref 5.0–7.5)

## 2022-05-15 MED ORDER — ATORVASTATIN CALCIUM 80 MG PO TABS: 80.0000 mg | ORAL_TABLET | Freq: Every day | ORAL | 1 refills | Status: DC

## 2022-05-15 MED ORDER — FLUOXETINE HCL 20 MG PO TABS: 20.0000 mg | ORAL_TABLET | Freq: Every day | ORAL | 1 refills | Status: DC

## 2022-05-15 MED ORDER — PANTOPRAZOLE SODIUM 40 MG PO TBEC
40.0000 mg | DELAYED_RELEASE_TABLET | Freq: Every day | ORAL | 1 refills | Status: DC
Start: 2022-05-15 — End: 2022-11-13

## 2022-05-15 MED ORDER — MONTELUKAST SODIUM 10 MG PO TABS: 10.0000 mg | ORAL_TABLET | Freq: Every day | ORAL | 3 refills | Status: DC

## 2022-05-15 NOTE — Assessment & Plan Note (Signed)
Chronic.  Controlled.  Continue with current medication regimen of Fluoxetine 20mg .  Refills sent today.  Labs ordered today.  Return to clinic in 6 months for reevaluation.  Call sooner if concerns arise.

## 2022-05-15 NOTE — Assessment & Plan Note (Signed)
Chronic.  Controlled.  Continue with current medication regimen of Atorvastatin 80mg.  Refills sent today.  Labs ordered today.  Return to clinic in 6 months for reevaluation.  Call sooner if concerns arise.   

## 2022-05-16 ENCOUNTER — Other Ambulatory Visit: Payer: Self-pay | Admitting: Nurse Practitioner

## 2022-05-16 ENCOUNTER — Telehealth: Payer: Self-pay

## 2022-05-16 DIAGNOSIS — G8929 Other chronic pain: Secondary | ICD-10-CM

## 2022-05-16 LAB — COMPREHENSIVE METABOLIC PANEL
ALT: 38 IU/L (ref 0–44)
AST: 34 IU/L (ref 0–40)
Albumin/Globulin Ratio: 1.5 (ref 1.2–2.2)
Albumin: 4.4 g/dL (ref 4.1–5.1)
Alkaline Phosphatase: 99 IU/L (ref 44–121)
BUN/Creatinine Ratio: 9 (ref 9–20)
BUN: 8 mg/dL (ref 6–24)
Bilirubin Total: 0.5 mg/dL (ref 0.0–1.2)
CO2: 21 mmol/L (ref 20–29)
Calcium: 9.3 mg/dL (ref 8.7–10.2)
Chloride: 102 mmol/L (ref 96–106)
Creatinine, Ser: 0.88 mg/dL (ref 0.76–1.27)
Globulin, Total: 3 g/dL (ref 1.5–4.5)
Glucose: 92 mg/dL (ref 70–99)
Potassium: 4.3 mmol/L (ref 3.5–5.2)
Sodium: 138 mmol/L (ref 134–144)
Total Protein: 7.4 g/dL (ref 6.0–8.5)
eGFR: 111 mL/min/{1.73_m2} (ref >59)

## 2022-05-16 LAB — CBC WITH DIFFERENTIAL/PLATELET
Basophils Absolute: 0.1 10*3/uL (ref 0.0–0.2)
Basos: 1 %
EOS (ABSOLUTE): 0.4 10*3/uL (ref 0.0–0.4)
Eos: 5 %
Hematocrit: 47.4 % (ref 37.5–51.0)
Hemoglobin: 14.5 g/dL (ref 13.0–17.7)
Immature Grans (Abs): 0 10*3/uL (ref 0.0–0.1)
Immature Granulocytes: 0 %
Lymphocytes Absolute: 2.5 10*3/uL (ref 0.7–3.1)
Lymphs: 34 %
MCH: 23.8 pg — ABNORMAL LOW (ref 26.6–33.0)
MCHC: 30.6 g/dL — ABNORMAL LOW (ref 31.5–35.7)
MCV: 78 fL — ABNORMAL LOW (ref 79–97)
Monocytes Absolute: 0.9 10*3/uL (ref 0.1–0.9)
Monocytes: 11 %
Neutrophils Absolute: 3.6 10*3/uL (ref 1.4–7.0)
Neutrophils: 49 %
Platelets: 280 10*3/uL (ref 150–450)
RBC: 6.09 x10E6/uL — ABNORMAL HIGH (ref 4.14–5.80)
RDW: 15.9 % — ABNORMAL HIGH (ref 11.6–15.4)
WBC: 7.4 10*3/uL (ref 3.4–10.8)

## 2022-05-16 LAB — LIPID PANEL
Chol/HDL Ratio: 3.7 ratio (ref 0.0–5.0)
Cholesterol, Total: 156 mg/dL (ref 100–199)
HDL: 42 mg/dL (ref >39)
LDL Chol Calc (NIH): 86 mg/dL (ref 0–99)
Triglycerides: 159 mg/dL — ABNORMAL HIGH (ref 0–149)
VLDL Cholesterol Cal: 28 mg/dL (ref 5–40)

## 2022-05-16 LAB — TSH: TSH: 1.94 u[IU]/mL (ref 0.450–4.500)

## 2022-05-16 NOTE — Progress Notes (Signed)
Please let patient know that his xray showed thickening of the patellar tendon.  It is recommended that her get an MRI of his knee.  I have ordered this.  Someone will reach out to him to get this scheduled.

## 2022-05-16 NOTE — Telephone Encounter (Signed)
Tiffany with [facility] calling to be sure PCP has seen knee xray, she has.

## 2022-05-16 NOTE — Progress Notes (Signed)
Hi Ricky Anderson. It was nice to see you yesterday.  Your lab work looks good.  No concerns at this time. Continue with your current medication regimen.  Follow up as discussed.  Please let me know if you have any questions.  

## 2022-05-22 ENCOUNTER — Telehealth: Payer: Self-pay | Admitting: Nurse Practitioner

## 2022-05-22 DIAGNOSIS — G8929 Other chronic pain: Secondary | ICD-10-CM

## 2022-05-22 NOTE — Telephone Encounter (Signed)
Patient has been notified via phone call.  

## 2022-05-22 NOTE — Telephone Encounter (Signed)
Please let patient know that his insurance denied the MRI that was ordered.  I placed a referral for him to see Orthopedics for further evaluation.

## 2022-05-23 ENCOUNTER — Ambulatory Visit: Payer: 59

## 2022-06-01 ENCOUNTER — Telehealth: Payer: Self-pay | Admitting: Nurse Practitioner

## 2022-06-01 NOTE — Telephone Encounter (Signed)
Copied from [place] [id number]. Topic: General - Other >> Jun 01, 2022  9:50 AM XUXYBFXO J wrote: Reason for CRM: Megan with [facility] called in for assistance. Jinny Blossom says that PCP put in a order for MRI for pt. The MRI was denied with insurance. They are now trying to place a MRI order and have it approved with insurance and is unable to due to the PCP MRI claim in system. Jinny Blossom would like for the office to reach out to pt's insurance and have claim cancelled so that they are able to place.    CB: [place] ext [phone]

## 2022-06-01 NOTE — Telephone Encounter (Signed)
Yeah that's fine

## 2022-06-05 NOTE — Telephone Encounter (Signed)
Sent to Daryl Eastern, Referral Coordinator. She will contact insurance company to cancel request.

## 2022-10-29 ENCOUNTER — Other Ambulatory Visit: Payer: Self-pay | Admitting: Nurse Practitioner

## 2022-10-30 NOTE — Telephone Encounter (Signed)
Requested Prescriptions  Pending Prescriptions Disp Refills   levocetirizine (XYZAL) 5 MG tablet [Pharmacy Med Name: Levocetirizine Dihydrochloride 5 MG Oral Tablet] 60 tablet 5    Sig: Take 1 tablet by mouth twice daily     Ear, Nose, and Throat:  Antihistamines - levocetirizine dihydrochloride Passed - 10/29/2022 11:45 AM      Passed - Cr in normal range and within 360 days    Creatinine  Date Value Ref Range Status  11/10/2013 1.12 0.60 - 1.30 mg/dL Final   Creatinine, Ser  Date Value Ref Range Status  05/15/2022 0.88 0.76 - 1.27 mg/dL Final         Passed - eGFR is 10 or above and within 360 days    EGFR (African American)  Date Value Ref Range Status  11/10/2013 >60  Final   GFR calc Af Amer  Date Value Ref Range Status  09/17/2020 110 >59 mL/min/1.73 Final    Comment:    **In accordance with recommendations from the NKF-ASN Task force,**   [facility] is in the process of updating its eGFR calculation to the   2021 CKD-EPI creatinine equation that estimates kidney function   without a race variable.    EGFR (Non-African Amer.)  Date Value Ref Range Status  11/10/2013 >60  Final    Comment:    eGFR values <61m/min/1.73 m2 may be an indication of chronic kidney disease (CKD). Calculated eGFR is useful in patients with stable renal function. The eGFR calculation will not be reliable in acutely ill patients when serum creatinine is changing rapidly. It is not useful in  patients on dialysis. The eGFR calculation may not be applicable to patients at the low and high extremes of body sizes, pregnant women, and vegetarians.    GFR calc non Af Amer  Date Value Ref Range Status  09/17/2020 96 >59 mL/min/1.73 Final   eGFR  Date Value Ref Range Status  05/15/2022 111 >59 mL/min/1.73 Final         Passed - Valid encounter within last 12 months    Recent Outpatient Visits           5 months ago Annual physical exam   [place]   1 [place]   1 year ago Annual physical exam   [place]   2 years ago Hyperlipidemia, unspecified hyperlipidemia type   [place]   2 years ago [id number]  [place] DO       Future Appointments             In 2 weeks [place] NP [facility] [facility]

## 2022-11-10 NOTE — Progress Notes (Unsigned)
There were no vitals taken for this visit.   Subjective:    Patient ID: Ricky Shire., male    DOB: 06/11/1981, 42 y.o.   MRN: [id number]  HPI: Ricky Brotherson. is a 42 y.o. male  No chief complaint on file.  HYPERLIPIDEMIA Hyperlipidemia status: excellent compliance Satisfied with current treatment?  yes Side effects:  no Medication compliance: excellent compliance Past cholesterol meds: Lipitor Supplements: none Aspirin:  no The 10-year ASCVD risk score (Arnett DK, et al., 2019) is: 5.1%   Values used to calculate the score:     Age: 50 years     Sex: Male     Is Non-Hispanic African American: Yes     Diabetic: No     Tobacco smoker: Yes     Systolic Blood Pressure: 123456 mmHg     Is BP treated: No     HDL Cholesterol: 42 mg/dL     Total Cholesterol: 156 mg/dL Chest pain:  no Coronary artery disease:  no Family history CAD:  no Family history early CAD:  no   ANXIETY/STRESS Duration:stable Anxious mood: no  Excessive worrying: no Irritability: no  Sweating: no Nausea: no Palpitations:no Hyperventilation: no Panic attacks: no Agoraphobia: no  Obscessions/compulsions: no Depressed mood: no    05/15/2022    8:34 AM 10/18/2021    8:15 AM 04/19/2021    8:01 AM 09/17/2020    3:52 PM 03/10/2020    2:29 PM  Depression screen PHQ 2/9  Decreased Interest 0 0 0 0 0  Down, Depressed, Hopeless 0 0 0 0 0  PHQ - 2 Score 0 0 0 0 0  Altered sleeping 0 0 0 0 0  Tired, decreased energy 0 0 1 0 0  Change in appetite 0 0 0 0 0  Feeling bad or failure about yourself  0 0 0 0 0  Trouble concentrating 0 0 0 0 0  Moving slowly or fidgety/restless 0 0 0 0 0  Suicidal thoughts 0 0 0 0 0  PHQ-9 Score 0 0 1 0 0  Difficult doing work/chores Not difficult at all Not difficult at all Not difficult at all Not difficult at all    Anhedonia: no Weight changes: no Insomnia: no  Hypersomnia: no Fatigue/loss of energy: no Feelings of worthlessness: no Feelings of guilt:  no Impaired concentration/indecisiveness: no Suicidal ideations: no  Crying spells: no Recent Stressors/Life Changes: no   Relationship problems: no   Family stress: no     Financial stress: no    Job stress: no    Recent death/loss: no  Patient states his right knee has been bothering him for the last couple of months.  States the pain is intermittent.  Feels like it is popping out of place at times. Hurts if he moves a certain way at times.    Relevant past medical, surgical, family and social history reviewed and updated as indicated. Interim medical history since our last visit reviewed. Allergies and medications reviewed and updated.  Review of Systems  HENT:         Dental abscess  Eyes:  Negative for visual disturbance.  Respiratory:  Negative for chest tightness and shortness of breath.   Cardiovascular:  Negative for chest pain, palpitations and leg swelling.  Musculoskeletal:        Right knee pain  Neurological:  Negative for dizziness, light-headedness and headaches.  Psychiatric/Behavioral:  Negative for dysphoric mood. The patient is not nervous/anxious.    Per HPI  unless specifically indicated above     Objective:    There were no vitals taken for this visit.  Wt Readings from Last 3 Encounters:  05/15/22 208 lb 6.4 oz (94.5 kg)  10/18/21 210 lb (95.3 kg)  04/19/21 220 lb (99.8 kg)    Physical Exam Vitals and nursing note reviewed.  Constitutional:      General: He is not in acute distress.    Appearance: Normal appearance. He is not ill-appearing, toxic-appearing or diaphoretic.  HENT:     Head: Normocephalic.     Right Ear: External ear normal.     Left Ear: External ear normal.     Nose: Nose normal. No congestion or rhinorrhea.     Mouth/Throat:     Mouth: Mucous membranes are moist.     Dentition: Abnormal dentition. Dental tenderness and dental abscesses present.  Eyes:     General:        Right eye: No discharge.        Left eye: No  discharge.     Extraocular Movements: Extraocular movements intact.     Conjunctiva/sclera: Conjunctivae normal.     Pupils: Pupils are equal, round, and reactive to light.  Cardiovascular:     Rate and Rhythm: Normal rate and regular rhythm.     Heart sounds: No murmur heard. Pulmonary:     Effort: Pulmonary effort is normal. No respiratory distress.     Breath sounds: Normal breath sounds. No wheezing, rhonchi or rales.  Abdominal:     General: Abdomen is flat. Bowel sounds are normal.  Musculoskeletal:     Cervical back: Normal range of motion and neck supple.     Right knee: Tenderness present.  Skin:    General: Skin is warm and dry.     Capillary Refill: Capillary refill takes less than 2 seconds.  Neurological:     General: No focal deficit present.     Mental Status: He is alert and oriented to person, place, and time.  Psychiatric:        Mood and Affect: Mood normal.        Behavior: Behavior normal.        Thought Content: Thought content normal.        Judgment: Judgment normal.    Results for orders placed or performed in visit on 05/15/22  Microscopic Examination   Urine  Result Value Ref Range   WBC, UA 0-5 0 - 5 /hpf   RBC, Urine 0-2 0 - 2 /hpf   Epithelial Cells (non renal) None seen 0 - 10 /hpf   Bacteria, UA None seen None seen/Few  TSH  Result Value Ref Range   TSH 1.940 0.450 - 4.500 uIU/mL  Lipid panel  Result Value Ref Range   Cholesterol, Total 156 100 - 199 mg/dL   Triglycerides 159 (H) 0 - 149 mg/dL   HDL 42 >39 mg/dL   VLDL Cholesterol Cal 28 5 - 40 mg/dL   LDL Chol Calc (NIH) 86 0 - 99 mg/dL   Chol/HDL Ratio 3.7 0.0 - 5.0 ratio  CBC with Differential/Platelet  Result Value Ref Range   WBC 7.4 3.4 - 10.8 x10E3/uL   RBC 6.09 (H) 4.14 - 5.80 x10E6/uL   Hemoglobin 14.5 13.0 - 17.7 g/dL   Hematocrit 47.4 37.5 - 51.0 %   MCV 78 (L) 79 - 97 fL   MCH 23.8 (L) 26.6 - 33.0 pg   MCHC 30.6 (L) 31.5 - 35.7 g/dL  RDW 15.9 (H) 11.6 - 15.4 %    Platelets 280 150 - 450 x10E3/uL   Neutrophils 49 Not Estab. %   Lymphs 34 Not Estab. %   Monocytes 11 Not Estab. %   Eos 5 Not Estab. %   Basos 1 Not Estab. %   Neutrophils Absolute 3.6 1.4 - 7.0 x10E3/uL   Lymphocytes Absolute 2.5 0.7 - 3.1 x10E3/uL   Monocytes Absolute 0.9 0.1 - 0.9 x10E3/uL   EOS (ABSOLUTE) 0.4 0.0 - 0.4 x10E3/uL   Basophils Absolute 0.1 0.0 - 0.2 x10E3/uL   Immature Granulocytes 0 Not Estab. %   Immature Grans (Abs) 0.0 0.0 - 0.1 x10E3/uL  Comprehensive metabolic panel  Result Value Ref Range   Glucose 92 70 - 99 mg/dL   BUN 8 6 - 24 mg/dL   Creatinine, Ser 0.88 0.76 - 1.27 mg/dL   eGFR 111 >59 mL/min/1.73   BUN/Creatinine Ratio 9 9 - 20   Sodium 138 134 - 144 mmol/L   Potassium 4.3 3.5 - 5.2 mmol/L   Chloride 102 96 - 106 mmol/L   CO2 21 20 - 29 mmol/L   Calcium 9.3 8.7 - 10.2 mg/dL   Total Protein 7.4 6.0 - 8.5 g/dL   Albumin 4.4 4.1 - 5.1 g/dL   Globulin, Total 3.0 1.5 - 4.5 g/dL   Albumin/Globulin Ratio 1.5 1.2 - 2.2   Bilirubin Total 0.5 0.0 - 1.2 mg/dL   Alkaline Phosphatase 99 44 - 121 IU/L   AST 34 0 - 40 IU/L   ALT 38 0 - 44 IU/L  Urinalysis, Routine w reflex microscopic  Result Value Ref Range   Specific Gravity, UA >1.030 (H) 1.005 - 1.030   pH, UA 6.0 5.0 - 7.5   Color, UA Yellow Yellow   Appearance Ur Clear Clear   Leukocytes,UA Negative Negative   Protein,UA Negative Negative/Trace   Glucose, UA Negative Negative   Ketones, UA Negative Negative   RBC, UA Trace (A) Negative   Bilirubin, UA Negative Negative   Urobilinogen, Ur 0.2 0.2 - 1.0 mg/dL   Nitrite, UA Negative Negative   Microscopic Examination See below:       Assessment & Plan:   Problem List Items Addressed This Visit      Digestive   GERD (gastroesophageal reflux disease)     Other   Anxiety   Hyperlipidemia - Primary     Follow up plan: No follow-ups on file.

## 2022-11-13 ENCOUNTER — Encounter: Payer: Self-pay | Admitting: Nurse Practitioner

## 2022-11-13 ENCOUNTER — Ambulatory Visit: Payer: 59 | Admitting: Nurse Practitioner

## 2022-11-13 VITALS — BP 130/84 | HR 76 | Temp 99.5°F | Wt 210.0 lb

## 2022-11-13 DIAGNOSIS — K219 Gastro-esophageal reflux disease without esophagitis: Secondary | ICD-10-CM | POA: Diagnosis not present

## 2022-11-13 DIAGNOSIS — M25562 Pain in left knee: Secondary | ICD-10-CM | POA: Diagnosis not present

## 2022-11-13 DIAGNOSIS — E785 Hyperlipidemia, unspecified: Secondary | ICD-10-CM

## 2022-11-13 DIAGNOSIS — G8929 Other chronic pain: Secondary | ICD-10-CM

## 2022-11-13 DIAGNOSIS — F419 Anxiety disorder, unspecified: Secondary | ICD-10-CM

## 2022-11-13 MED ORDER — FLUTICASONE PROPIONATE 50 MCG/ACT NA SUSP: 2.0000 | Freq: Two times a day (BID) | NASAL | 11 refills | Status: AC

## 2022-11-13 MED ORDER — FLUOXETINE HCL 20 MG PO TABS: 20.0000 mg | ORAL_TABLET | Freq: Every day | ORAL | 1 refills | Status: DC

## 2022-11-13 MED ORDER — ATORVASTATIN CALCIUM 80 MG PO TABS: 80.0000 mg | ORAL_TABLET | Freq: Every day | ORAL | 1 refills | Status: DC

## 2022-11-13 MED ORDER — PANTOPRAZOLE SODIUM 40 MG PO TBEC: 40.0000 mg | DELAYED_RELEASE_TABLET | Freq: Every day | ORAL | 1 refills | Status: DC

## 2022-11-13 NOTE — Assessment & Plan Note (Signed)
Chronic.  Controlled.  Continue with current medication regimen of Fluoxetine daily.  Refills sent today.  Labs ordered today.  Return to clinic in 6 months for reevaluation.  Call sooner if concerns arise.

## 2022-11-13 NOTE — Assessment & Plan Note (Signed)
Chronic.  Controlled.  Continue with current medication regimen of Atorvastatin daily.  Labs ordered today.  Return to clinic in 6 months for reevaluation.  Call sooner if concerns arise.   

## 2022-11-13 NOTE — Assessment & Plan Note (Signed)
Chronic. Not well controlled.  Having to use additional medications to help control symptoms.  Referral placed for GI.

## 2022-11-14 LAB — LIPID PANEL
Chol/HDL Ratio: 3.5 ratio (ref 0.0–5.0)
Cholesterol, Total: 150 mg/dL (ref 100–199)
HDL: 43 mg/dL (ref >39)
LDL Chol Calc (NIH): 91 mg/dL (ref 0–99)
Triglycerides: 86 mg/dL (ref 0–149)
VLDL Cholesterol Cal: 16 mg/dL (ref 5–40)

## 2022-11-14 LAB — COMPREHENSIVE METABOLIC PANEL
ALT: 49 IU/L — ABNORMAL HIGH (ref 0–44)
AST: 38 IU/L (ref 0–40)
Albumin/Globulin Ratio: 1.6 (ref 1.2–2.2)
Albumin: 4.6 g/dL (ref 4.1–5.1)
Alkaline Phosphatase: 99 IU/L (ref 44–121)
BUN/Creatinine Ratio: 11 (ref 9–20)
BUN: 10 mg/dL (ref 6–24)
Bilirubin Total: 0.3 mg/dL (ref 0.0–1.2)
CO2: 21 mmol/L (ref 20–29)
Calcium: 9.4 mg/dL (ref 8.7–10.2)
Chloride: 104 mmol/L (ref 96–106)
Creatinine, Ser: 0.92 mg/dL (ref 0.76–1.27)
Globulin, Total: 2.9 g/dL (ref 1.5–4.5)
Glucose: 87 mg/dL (ref 70–99)
Potassium: 4.3 mmol/L (ref 3.5–5.2)
Sodium: 139 mmol/L (ref 134–144)
Total Protein: 7.5 g/dL (ref 6.0–8.5)
eGFR: 107 mL/min/{1.73_m2} (ref >59)

## 2022-11-14 NOTE — Progress Notes (Signed)
Hi Taten. It was nice to see you yesterday.  Your lab work looks good.  No concerns at this time. Continue with your current medication regimen.  Follow up as discussed.  Please let me know if you have any questions.

## 2022-12-07 ENCOUNTER — Other Ambulatory Visit: Payer: Self-pay

## 2022-12-07 ENCOUNTER — Encounter: Payer: Self-pay | Admitting: Nurse Practitioner

## 2022-12-07 DIAGNOSIS — E785 Hyperlipidemia, unspecified: Secondary | ICD-10-CM

## 2022-12-07 DIAGNOSIS — F419 Anxiety disorder, unspecified: Secondary | ICD-10-CM

## 2022-12-07 MED ORDER — LEVOCETIRIZINE DIHYDROCHLORIDE 5 MG PO TABS: 5.0000 mg | ORAL_TABLET | Freq: Two times a day (BID) | ORAL | 1 refills | Status: DC

## 2022-12-07 MED ORDER — FLUOXETINE HCL 20 MG PO TABS: 20.0000 mg | ORAL_TABLET | Freq: Every day | ORAL | 1 refills | Status: DC

## 2022-12-07 MED ORDER — ATORVASTATIN CALCIUM 80 MG PO TABS: 80.0000 mg | ORAL_TABLET | Freq: Every day | ORAL | 1 refills | Status: DC

## 2022-12-07 MED ORDER — MONTELUKAST SODIUM 10 MG PO TABS: 10.0000 mg | ORAL_TABLET | Freq: Every day | ORAL | 3 refills | Status: DC

## 2022-12-07 MED ORDER — PANTOPRAZOLE SODIUM 40 MG PO TBEC: 40.0000 mg | DELAYED_RELEASE_TABLET | Freq: Every day | ORAL | 1 refills | Status: DC

## 2022-12-07 NOTE — Telephone Encounter (Signed)
Patient is now using mail order pharmacy.  

## 2023-02-27 ENCOUNTER — Encounter: Payer: Self-pay | Admitting: Gastroenterology

## 2023-02-27 ENCOUNTER — Ambulatory Visit (INDEPENDENT_AMBULATORY_CARE_PROVIDER_SITE_OTHER): Payer: 59 | Admitting: Gastroenterology

## 2023-02-27 VITALS — BP 151/95 | HR 92 | Temp 98.7°F | Ht 72.0 in | Wt 207.0 lb

## 2023-02-27 DIAGNOSIS — K219 Gastro-esophageal reflux disease without esophagitis: Secondary | ICD-10-CM

## 2023-02-27 MED ORDER — NA SULFATE-K SULFATE-MG SULF 17.5-3.13-1.6 GM/177ML PO SOLN: 1.0000 | Freq: Once | ORAL | 0 refills | Status: DC

## 2023-02-27 NOTE — Progress Notes (Signed)
Gastroenterology Consultation  Referring Provider:     Larae Grooms, NP Primary Care Physician:  Larae Grooms, NP Primary Gastroenterologist:  Dr. Servando Snare     Reason for Consultation:     GERD        HPI:   Ricky Anderson. is a 42 y.o. y/o male referred for consultation & management of GERD by Dr. Larae Grooms, NP.  This patient comes to see me after being seen by his primary care provider back in March.  At that time the patient states that he was taking pantoprazole for his reflux and felt that it was not working as well as it had in the past and was taking Nexium in addition to his pantoprazole.  The patient was also noted to have an elevated ALT.  His recent labs have shown:  Component     Latest Ref Rng 04/19/2021 10/18/2021 05/15/2022 11/13/2022  Total Bilirubin     0.0 - 1.2 mg/dL 0.4  0.3  0.5  0.3   Alkaline Phosphatase     44 - 121 IU/L 103  104  99  99   AST     0 - 40 IU/L 41 (H)  36  34  38   ALT     0 - 44 IU/L 51 (H)  36  38  49 (H)    His hepatitis C antibody was negative in 2021.   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  The patient states he has a 12 pack on the weekend but will intermittently have alcohol during the week but not to excess.  The patient reports that he has heartburn with acid breakthrough at least 3 times a week and he relates it to the food that he is eating.  He states it is mostly at night and can wake him up in the middle the night with a feeling of burning in his mouth and choking.  The patient denies any unexplained weight loss dysphagia fevers chills nausea or vomiting.  He also denies any black stools or bloody stools.  The patient takes his Protonix in the morning at the present time.  Past Medical History:  Diagnosis Date   Seasonal allergies     No past surgical history on file.  Prior to Admission  medications   Medication Sig Start Date End Date Taking? Authorizing Provider  atorvastatin (LIPITOR) 80 MG tablet Take 1 tablet (80 mg total) by mouth daily. 12/07/22   Larae Grooms, NP  FLUoxetine (PROZAC) 20 MG tablet Take 1 tablet (20 mg total) by mouth daily. 12/07/22   Larae Grooms, NP  fluticasone (FLONASE) 50 MCG/ACT nasal spray Place 2 sprays into both nostrils 2 (two) times daily. 11/13/22   Larae Grooms, NP  levocetirizine (XYZAL) 5 MG tablet Take 1 tablet (5 mg total) by mouth 2 (two) times daily. 12/07/22   Larae Grooms, NP  montelukast (SINGULAIR) 10 MG tablet Take 1 tablet (10 mg total) by mouth at bedtime. 12/07/22   Larae Grooms, NP  pantoprazole (PROTONIX) 40 MG tablet Take 1 tablet (40 mg total) by mouth daily. 12/07/22   Larae Grooms, NP    Family History  Problem Relation Age of Onset   Cancer Mother    Heart disease Father    Kidney disease Maternal Grandmother      Social History   Tobacco Use  Smoking status: Every Day    Packs/day: .5    Types: Cigarettes   Smokeless tobacco: Never  Vaping Use   Vaping Use: Never used  Substance Use Topics   Alcohol use: Yes    Alcohol/week: 2.0 standard drinks of alcohol    Types: 2 Cans of beer per week    Comment: per day, liquor on the weekends   Drug use: No    Allergies as of 02/27/2023   (No Known Allergies)    Review of Systems:    All systems reviewed and negative except where noted in HPI.   Physical Exam:  There were no vitals taken for this visit. No LMP for male patient. General:   Alert,  Well-developed, well-nourished, pleasant and cooperative in NAD Head:  Normocephalic and atraumatic. Eyes:  Sclera clear, no icterus.   Conjunctiva pink. Ears:  Normal auditory acuity. Neck:  Supple; no masses or thyromegaly. Lungs:  Respirations even and unlabored.  Clear throughout to auscultation.   No wheezes, crackles, or rhonchi. No acute distress. Heart:  Regular rate and rhythm;  no murmurs, clicks, rubs, or gallops. Abdomen:  Normal bowel sounds.  No bruits.  Soft, non-tender and non-distended without masses, hepatosplenomegaly or hernias noted.  No guarding or rebound tenderness.  Negative Carnett sign.   Rectal:  Deferred.  Pulses:  Normal pulses noted. Extremities:  No clubbing or edema.  No cyanosis. Neurologic:  Alert and oriented x3;  grossly normal neurologically. Skin:  Intact without significant lesions or rashes.  No jaundice. Lymph Nodes:  No significant cervical adenopathy. Psych:  Alert and cooperative. Normal mood and affect.  Imaging Studies: No results found.  Assessment and Plan:   Ricky Anderson. is a 42 y.o. y/o male who comes in today with a history of reflux on Protonix with a lot of acid breakthrough on average 3 times a week related to food that he eats.  Most of his acid breakthrough is when he is laying down at night.  The patient has been told to avoid eating 3 hours before he goes to sleep and has also been told to avoid carbonated drinks, caffeine, tomato sauce and tomato paste and he has also been told that he should change his medication to the evening because most of his symptoms seem to be at night.  The patient will also be set up for an EGD due to his long-term GERD symptoms.  The patient has been explained the plan agrees with it.    Midge Minium, MD. Clementeen Graham    Note: This dictation was prepared with Dragon dictation along with smaller phrase technology. Any transcriptional errors that result from this process are unintentional.

## 2023-02-27 NOTE — Addendum Note (Signed)
Addended by: Roena Malady on: 02/27/2023 03:44 PM   Modules accepted: Orders

## 2023-04-17 ENCOUNTER — Other Ambulatory Visit: Payer: Self-pay | Admitting: Nurse Practitioner

## 2023-04-17 DIAGNOSIS — E785 Hyperlipidemia, unspecified: Secondary | ICD-10-CM

## 2023-04-19 NOTE — Telephone Encounter (Signed)
Requested Prescriptions  Pending Prescriptions Disp Refills   pantoprazole (PROTONIX) 40 MG tablet [Pharmacy Med Name: Pantoprazole Sodium 40 MG Oral Tablet Delayed Release] 90 tablet 0    Sig: TAKE 1 TABLET BY MOUTH DAILY     Gastroenterology: Proton Pump Inhibitors Passed - 04/17/2023 10:41 PM      Passed - Valid encounter within last 12 months    Recent Outpatient Visits           5 months ago Hyperlipidemia, unspecified hyperlipidemia type   [place] [facility] Larae Grooms, NP   11 months ago Annual physical exam   [place] [facility] Larae Grooms, NP   1 year ago Anxiety   [place] [facility] Larae Grooms, NP   2 years ago Annual physical exam   [place] [facility] Larae Grooms, NP   2 years ago Hyperlipidemia, unspecified hyperlipidemia type   [place] [facility] Larae Grooms, NP       Future Appointments             In 1 month Larae Grooms, NP [facility] [facility]             atorvastatin (LIPITOR) 80 MG tablet [Pharmacy Med Name: Atorvastatin Calcium 80 MG Oral Tablet] 90 tablet 0    Sig: TAKE 1 TABLET BY MOUTH DAILY     Cardiovascular:  Antilipid - Statins Failed - 04/17/2023 10:41 PM      Failed - Lipid Panel in normal range within the last 12 months    Cholesterol, Total  Date Value Ref Range Status  11/13/2022 150 100 - 199 mg/dL Final   LDL Chol Calc (NIH)  Date Value Ref Range Status  11/13/2022 91 0 - 99 mg/dL Final   HDL  Date Value Ref Range Status  11/13/2022 43 >39 mg/dL Final   Triglycerides  Date Value Ref Range Status  11/13/2022 86 0 - 149 mg/dL Final         Passed - Patient is not pregnant      Passed - Valid encounter within last 12 months    Recent Outpatient Visits           5 months ago Hyperlipidemia, unspecified hyperlipidemia type   [place] [facility] Larae Grooms,  NP   11 months ago Annual physical exam   [place] [facility] Larae Grooms, NP   1 year ago Anxiety   [place] [facility] Larae Grooms, NP   2 years ago Annual physical exam   [place] [facility] Larae Grooms, NP   2 years ago Hyperlipidemia, unspecified hyperlipidemia type   [place] [facility] Larae Grooms, NP       Future Appointments             In 1 month Larae Grooms, NP [place] [facility]

## 2023-04-20 ENCOUNTER — Encounter: Payer: Self-pay | Admitting: Gastroenterology

## 2023-04-20 MED ORDER — SODIUM CHLORIDE 0.9 % IV SOLN: INTRAVENOUS | Status: DC

## 2023-04-24 ENCOUNTER — Ambulatory Visit: Admission: RE | Admit: 2023-04-24 | Payer: 59 | Source: Home / Self Care | Admitting: Gastroenterology

## 2023-04-24 ENCOUNTER — Encounter: Admission: RE | Payer: Self-pay | Source: Home / Self Care

## 2023-04-24 ENCOUNTER — Encounter: Payer: Self-pay | Admitting: Certified Registered Nurse Anesthetist

## 2023-04-24 DIAGNOSIS — K219 Gastro-esophageal reflux disease without esophagitis: Secondary | ICD-10-CM

## 2023-04-24 SURGERY — ESOPHAGOGASTRODUODENOSCOPY (EGD) WITH PROPOFOL
Anesthesia: General

## 2023-05-24 ENCOUNTER — Encounter: Payer: 59 | Admitting: Nurse Practitioner

## 2023-06-20 NOTE — Progress Notes (Unsigned)
There were no vitals taken for this visit.   Subjective:    Patient ID: Ricky Nordmann., male    DOB: 1981/02/02, 42 y.o.   MRN: [id number]  HPI: Ricky Anderson. is a 42 y.o. male presenting on 06/21/2023 for comprehensive medical examination. Current medical complaints include:none  He currently lives with: Interim Problems from his last visit: no  HYPERLIPIDEMIA Hyperlipidemia status: excellent compliance Satisfied with current treatment?  yes Side effects:  no Medication compliance: excellent compliance Past cholesterol meds: Atorvastatin Supplements: none Aspirin:  no The 10-year ASCVD risk score (Arnett DK, et al., 2019) is: 8.1%   Values used to calculate the score:     Age: 9 years     Sex: Male     Is Non-Hispanic African American: Yes     Diabetic: No     Tobacco smoker: Yes     Systolic Blood Pressure: 156 mmHg     Is BP treated: No     HDL Cholesterol: 43 mg/dL     Total Cholesterol: 150 mg/dL Chest pain:  no Coronary artery disease:  no Family history CAD:  no Family history early CAD:  no  GERD GERD control status: {Blank single:19197::"controlled","uncontrolled","better","worse","exacerbated","stable"}Satisfied with current treatment? {Blank single:19197::"yes","no"} Heartburn frequency:  Medication side effects: {Blank single:19197::"yes","no"}  Medication compliance: {Blank multiple:19196::"better","worse","stable","fluctuating"} Previous GERD medications: Antacid use frequency:   Duration:  Nature:  Location:  Heartburn duration:  Alleviatiating factors:   Aggravating factors:  Dysphagia: {Blank single:19197::"yes","no"} Odynophagia:  {Blank single:19197::"yes","no"} Hematemesis: {Blank single:19197::"yes","no"} Blood in stool: {Blank single:19197::"yes","no"} EGD: {Blank single:19197::"yes","no"}   ANXIETY Patient states he is doing well.  Denies concerns at visit today.  Feels like the Fluoxetine is working well for  him.  Depression Screen done today and results listed below:     11/13/2022    8:12 AM 05/15/2022    8:34 AM 10/18/2021    8:15 AM 04/19/2021    8:01 AM 09/17/2020    3:52 PM  Depression screen PHQ 2/9  Decreased Interest 0 0 0 0 0  Down, Depressed, Hopeless 0 0 0 0 0  PHQ - 2 Score 0 0 0 0 0  Altered sleeping 0 0 0 0 0  Tired, decreased energy 0 0 0 1 0  Change in appetite 0 0 0 0 0  Feeling bad or failure about yourself  0 0 0 0 0  Trouble concentrating 0 0 0 0 0  Moving slowly or fidgety/restless 0 0 0 0 0  Suicidal thoughts 0 0 0 0 0  PHQ-9 Score 0 0 0 1 0  Difficult doing work/chores Not difficult at all Not difficult at all Not difficult at all Not difficult at all Not difficult at all    The patient does not have a history of falls. I did complete a risk assessment for falls. A plan of care for falls was documented.   Past Medical History:  Past Medical History:  Diagnosis Date   Seasonal allergies     Surgical History:  No past surgical history on file.  Medications:  Current Outpatient Medications on File Prior to Visit  Medication Sig   atorvastatin (LIPITOR) 80 MG tablet TAKE 1 TABLET BY MOUTH DAILY   FLUoxetine (PROZAC) 20 MG tablet Take 1 tablet (20 mg total) by mouth daily.   fluticasone (FLONASE) 50 MCG/ACT nasal spray Place 2 sprays into both nostrils 2 (two) times daily.   levocetirizine (XYZAL) 5 MG tablet Take 1 tablet (5 mg total) by mouth  2 (two) times daily.   montelukast (SINGULAIR) 10 MG tablet Take 1 tablet (10 mg total) by mouth at bedtime.   pantoprazole (PROTONIX) 40 MG tablet TAKE 1 TABLET BY MOUTH DAILY   No current facility-administered medications on file prior to visit.    Allergies:  No Known Allergies  Social History:  Social History   Socioeconomic History   Marital status: Single    Spouse name: Not on file   Number of children: Not on file   Years of education: Not on file   Highest education level: Not on file   Occupational History   Not on file  Tobacco Use   Smoking status: Every Day    Current packs/day: 0.50    Types: Cigarettes   Smokeless tobacco: Never  Vaping Use   Vaping status: Never Used  Substance and Sexual Activity   Alcohol use: Yes    Alcohol/week: 2.0 standard drinks of alcohol    Types: 2 Cans of beer per week    Comment: per day, liquor on the weekends   Drug use: No   Sexual activity: Yes  Other Topics Concern   Not on file  Social History Narrative   Not on file   Social Determinants of Health   Financial Resource Strain: Not on file  Food Insecurity: Not on file  Transportation Needs: Not on file  Physical Activity: Not on file  Stress: Not on file  Social Connections: Not on file  Intimate Partner Violence: Not on file   Social History   Tobacco Use  Smoking Status Every Day   Current packs/day: 0.50   Types: Cigarettes  Smokeless Tobacco Never   Social History   Substance and Sexual Activity  Alcohol Use Yes   Alcohol/week: 2.0 standard drinks of alcohol   Types: 2 Cans of beer per week   Comment: per day, liquor on the weekends    Family History:  Family History  Problem Relation Age of Onset   Cancer Mother    Heart disease Father    Kidney disease Maternal Grandmother     Past medical history, surgical history, medications, allergies, family history and social history reviewed with patient today and changes made to appropriate areas of the chart.   ROS All other ROS negative except what is listed above and in the HPI.      Objective:    There were no vitals taken for this visit.  Wt Readings from Last 3 Encounters:  02/27/23 207 lb (93.9 kg)  11/13/22 210 lb (95.3 kg)  05/15/22 208 lb 6.4 oz (94.5 kg)    Physical Exam Vitals and nursing note reviewed.  Constitutional:      General: He is not in acute distress.    Appearance: Normal appearance. He is normal weight. He is not ill-appearing, toxic-appearing or diaphoretic.   HENT:     Head: Normocephalic.     Right Ear: Tympanic membrane, ear canal and external ear normal.     Left Ear: Tympanic membrane, ear canal and external ear normal.     Nose: Nose normal. No congestion or rhinorrhea.     Mouth/Throat:     Mouth: Mucous membranes are moist.  Eyes:     General:        Right eye: No discharge.        Left eye: No discharge.     Extraocular Movements: Extraocular movements intact.     Conjunctiva/sclera: Conjunctivae normal.     Pupils: Pupils  are equal, round, and reactive to light.  Cardiovascular:     Rate and Rhythm: Normal rate and regular rhythm.     Heart sounds: No murmur heard. Pulmonary:     Effort: Pulmonary effort is normal. No respiratory distress.     Breath sounds: Normal breath sounds. No wheezing, rhonchi or rales.  Abdominal:     General: Abdomen is flat. Bowel sounds are normal. There is no distension.     Palpations: Abdomen is soft.     Tenderness: There is no abdominal tenderness. There is no guarding.  Musculoskeletal:     Cervical back: Normal range of motion and neck supple.  Skin:    General: Skin is warm and dry.     Capillary Refill: Capillary refill takes less than 2 seconds.  Neurological:     General: No focal deficit present.     Mental Status: He is alert and oriented to person, place, and time.     Cranial Nerves: No cranial nerve deficit.     Motor: No weakness.     Deep Tendon Reflexes: Reflexes normal.  Psychiatric:        Mood and Affect: Mood normal.        Behavior: Behavior normal.        Thought Content: Thought content normal.        Judgment: Judgment normal.     Results for orders placed or performed in visit on 11/13/22  Comp Met (CMET)  Result Value Ref Range   Glucose 87 70 - 99 mg/dL   BUN 10 6 - 24 mg/dL   Creatinine, Ser 9.60 0.76 - 1.27 mg/dL   eGFR 454 >09 WJ/XBJ/4.78   BUN/Creatinine Ratio 11 9 - 20   Sodium 139 134 - 144 mmol/L   Potassium 4.3 3.5 - 5.2 mmol/L   Chloride 104  96 - 106 mmol/L   CO2 21 20 - 29 mmol/L   Calcium 9.4 8.7 - 10.2 mg/dL   Total Protein 7.5 6.0 - 8.5 g/dL   Albumin 4.6 4.1 - 5.1 g/dL   Globulin, Total 2.9 1.5 - 4.5 g/dL   Albumin/Globulin Ratio 1.6 1.2 - 2.2   Bilirubin Total 0.3 0.0 - 1.2 mg/dL   Alkaline Phosphatase 99 44 - 121 IU/L   AST 38 0 - 40 IU/L   ALT 49 (H) 0 - 44 IU/L  Lipid Profile  Result Value Ref Range   Cholesterol, Total 150 100 - 199 mg/dL   Triglycerides 86 0 - 149 mg/dL   HDL 43 >29 mg/dL   VLDL Cholesterol Cal 16 5 - 40 mg/dL   LDL Chol Calc (NIH) 91 0 - 99 mg/dL   Chol/HDL Ratio 3.5 0.0 - 5.0 ratio      Assessment & Plan:   Problem List Items Addressed This Visit       Digestive   GERD (gastroesophageal reflux disease) - Primary     Other   Anxiety   Hyperlipidemia     Discussed aspirin prophylaxis for myocardial infarction prevention and decision was it was not indicated  LABORATORY TESTING:  Health maintenance labs ordered today as discussed above.    IMMUNIZATIONS:   - Tdap: Tetanus vaccination status reviewed: last tetanus booster within 10 years. - Influenza: Refused - Pneumovax: Not applicable - Prevnar: Not applicable - COVID: Not applicable - HPV: Not applicable - Shingrix vaccine: Not applicable  SCREENING: - Colonoscopy: Not applicable  Discussed with patient purpose of the colonoscopy is to detect colon  cancer at curable precancerous or early stages   - AAA Screening: Not applicable  -Hearing Test: Not applicable  -Spirometry: Not applicable   PATIENT COUNSELING:    Sexuality: Discussed sexually transmitted diseases, partner selection, use of condoms, avoidance of unintended pregnancy  and contraceptive alternatives.   Advised to avoid cigarette smoking.  I discussed with the patient that most people either abstain from alcohol or drink within safe limits (<=14/week and <=4 drinks/occasion for males, <=7/weeks and <= 3 drinks/occasion for females) and that the risk  for alcohol disorders and other health effects rises proportionally with the number of drinks per week and how often a drinker exceeds daily limits.  Discussed cessation/primary prevention of drug use and availability of treatment for abuse.   Diet: Encouraged to adjust caloric intake to maintain  or achieve ideal body weight, to reduce intake of dietary saturated fat and total fat, to limit sodium intake by avoiding high sodium foods and not adding table salt, and to maintain adequate dietary potassium and calcium preferably from fresh fruits, vegetables, and low-fat dairy products.    stressed the importance of regular exercise  Injury prevention: Discussed safety belts, safety helmets, smoke detector, smoking near bedding or upholstery.   Dental health: Discussed importance of regular tooth brushing, flossing, and dental visits.   Follow up plan: NEXT PREVENTATIVE PHYSICAL DUE IN 1 YEAR. No follow-ups on file.

## 2023-06-21 ENCOUNTER — Encounter: Payer: Self-pay | Admitting: Nurse Practitioner

## 2023-06-21 ENCOUNTER — Ambulatory Visit: Payer: 59 | Admitting: Nurse Practitioner

## 2023-06-21 VITALS — BP 128/88 | HR 74 | Temp 98.7°F | Ht 72.5 in | Wt 205.0 lb

## 2023-06-21 DIAGNOSIS — F419 Anxiety disorder, unspecified: Secondary | ICD-10-CM

## 2023-06-21 DIAGNOSIS — K219 Gastro-esophageal reflux disease without esophagitis: Secondary | ICD-10-CM | POA: Diagnosis not present

## 2023-06-21 DIAGNOSIS — Z0000 Encounter for general adult medical examination without abnormal findings: Secondary | ICD-10-CM | POA: Diagnosis not present

## 2023-06-21 DIAGNOSIS — E785 Hyperlipidemia, unspecified: Secondary | ICD-10-CM | POA: Diagnosis not present

## 2023-06-21 DIAGNOSIS — D649 Anemia, unspecified: Secondary | ICD-10-CM

## 2023-06-21 LAB — URINALYSIS, ROUTINE W REFLEX MICROSCOPIC
Bilirubin, UA: NEGATIVE
Glucose, UA: NEGATIVE
Ketones, UA: NEGATIVE
Leukocytes,UA: NEGATIVE
Nitrite, UA: NEGATIVE
Protein,UA: NEGATIVE
Specific Gravity, UA: 1.01 (ref 1.005–1.030)
Urobilinogen, Ur: 0.2 mg/dL (ref 0.2–1.0)
pH, UA: 6 (ref 5.0–7.5)

## 2023-06-21 LAB — MICROSCOPIC EXAMINATION
Bacteria, UA: NONE SEEN
Epithelial Cells (non renal): NONE SEEN /[HPF] (ref 0–10)
WBC, UA: NONE SEEN /[HPF] (ref 0–5)

## 2023-06-21 MED ORDER — PANTOPRAZOLE SODIUM 40 MG PO TBEC: 40.0000 mg | DELAYED_RELEASE_TABLET | Freq: Every day | ORAL | 1 refills | Status: DC

## 2023-06-21 MED ORDER — FLUOXETINE HCL 20 MG PO TABS: 20.0000 mg | ORAL_TABLET | Freq: Every day | ORAL | 1 refills | Status: DC

## 2023-06-21 MED ORDER — MONTELUKAST SODIUM 10 MG PO TABS: 10.0000 mg | ORAL_TABLET | Freq: Every day | ORAL | 3 refills | Status: DC

## 2023-06-21 MED ORDER — ATORVASTATIN CALCIUM 80 MG PO TABS
80.0000 mg | ORAL_TABLET | Freq: Every day | ORAL | 1 refills | Status: DC
Start: 2023-06-21 — End: 2023-07-05

## 2023-06-21 NOTE — Assessment & Plan Note (Signed)
Chronic.  Controlled.  Continue with current medication regimen of Fluoxetine 20mg  daily.  Refills sent today.  Labs ordered today.  Return to clinic in 6 months for reevaluation.  Call sooner if concerns arise.

## 2023-06-21 NOTE — Assessment & Plan Note (Signed)
Chronic. Controlled with pantoprazole nightly.

## 2023-06-21 NOTE — Assessment & Plan Note (Signed)
Chronic.  Controlled.  Continue with current medication regimen of Atorvastatin 80mg daily. Refills sent today.  Labs ordered today.  Return to clinic in 6 months for reevaluation.  Call sooner if concerns arise.   

## 2023-06-22 LAB — CBC WITH DIFFERENTIAL/PLATELET
Basophils Absolute: 0.1 10*3/uL (ref 0.0–0.2)
Basos: 1 %
EOS (ABSOLUTE): 0.3 10*3/uL (ref 0.0–0.4)
Eos: 3 %
Hematocrit: 44.9 % (ref 37.5–51.0)
Hemoglobin: 13.7 g/dL (ref 13.0–17.7)
Immature Grans (Abs): 0 10*3/uL (ref 0.0–0.1)
Immature Granulocytes: 0 %
Lymphocytes Absolute: 2.6 10*3/uL (ref 0.7–3.1)
Lymphs: 34 %
MCH: 23.3 pg — ABNORMAL LOW (ref 26.6–33.0)
MCHC: 30.5 g/dL — ABNORMAL LOW (ref 31.5–35.7)
MCV: 76 fL — ABNORMAL LOW (ref 79–97)
Monocytes Absolute: 0.8 10*3/uL (ref 0.1–0.9)
Monocytes: 11 %
Neutrophils Absolute: 3.8 10*3/uL (ref 1.4–7.0)
Neutrophils: 51 %
Platelets: 277 10*3/uL (ref 150–450)
RBC: 5.89 x10E6/uL — ABNORMAL HIGH (ref 4.14–5.80)
RDW: 16.7 % — ABNORMAL HIGH (ref 11.6–15.4)
WBC: 7.5 10*3/uL (ref 3.4–10.8)

## 2023-06-22 LAB — COMPREHENSIVE METABOLIC PANEL
ALT: 36 [IU]/L (ref 0–44)
AST: 40 [IU]/L (ref 0–40)
Albumin: 4.6 g/dL (ref 4.1–5.1)
Alkaline Phosphatase: 93 [IU]/L (ref 44–121)
BUN/Creatinine Ratio: 8 — ABNORMAL LOW (ref 9–20)
BUN: 7 mg/dL (ref 6–24)
Bilirubin Total: 0.5 mg/dL (ref 0.0–1.2)
CO2: 21 mmol/L (ref 20–29)
Calcium: 9.4 mg/dL (ref 8.7–10.2)
Chloride: 105 mmol/L (ref 96–106)
Creatinine, Ser: 0.93 mg/dL (ref 0.76–1.27)
Globulin, Total: 2.8 g/dL (ref 1.5–4.5)
Glucose: 94 mg/dL (ref 70–99)
Potassium: 4.2 mmol/L (ref 3.5–5.2)
Sodium: 141 mmol/L (ref 134–144)
Total Protein: 7.4 g/dL (ref 6.0–8.5)
eGFR: 105 mL/min/{1.73_m2} (ref >59)

## 2023-06-22 LAB — LIPID PANEL
Chol/HDL Ratio: 3.4 ratio (ref 0.0–5.0)
Cholesterol, Total: 157 mg/dL (ref 100–199)
HDL: 46 mg/dL (ref >39)
LDL Chol Calc (NIH): 96 mg/dL (ref 0–99)
Triglycerides: 77 mg/dL (ref 0–149)
VLDL Cholesterol Cal: 15 mg/dL (ref 5–40)

## 2023-06-22 LAB — TSH: TSH: 0.99 u[IU]/mL (ref 0.450–4.500)

## 2023-06-22 NOTE — Addendum Note (Signed)
Addended by: Larae Grooms on: 06/22/2023 08:10 AM   Modules accepted: Orders

## 2023-07-05 ENCOUNTER — Ambulatory Visit (INDEPENDENT_AMBULATORY_CARE_PROVIDER_SITE_OTHER): Payer: 59 | Admitting: Nurse Practitioner

## 2023-07-05 VITALS — BP 138/86 | HR 94 | Temp 98.4°F | Ht 73.0 in | Wt 211.4 lb

## 2023-07-05 DIAGNOSIS — D649 Anemia, unspecified: Secondary | ICD-10-CM

## 2023-07-05 DIAGNOSIS — F419 Anxiety disorder, unspecified: Secondary | ICD-10-CM

## 2023-07-05 DIAGNOSIS — E785 Hyperlipidemia, unspecified: Secondary | ICD-10-CM | POA: Diagnosis not present

## 2023-07-05 MED ORDER — FLUOXETINE HCL 20 MG PO TABS: 20.0000 mg | ORAL_TABLET | Freq: Every day | ORAL | 1 refills | Status: DC

## 2023-07-05 MED ORDER — MONTELUKAST SODIUM 10 MG PO TABS: 10.0000 mg | ORAL_TABLET | Freq: Every day | ORAL | 3 refills | Status: DC

## 2023-07-05 MED ORDER — ATORVASTATIN CALCIUM 80 MG PO TABS
80.0000 mg | ORAL_TABLET | Freq: Every day | ORAL | 1 refills | Status: DC
Start: 2023-07-05 — End: 2023-12-19

## 2023-07-05 MED ORDER — PANTOPRAZOLE SODIUM 40 MG PO TBEC: 40.0000 mg | DELAYED_RELEASE_TABLET | Freq: Every day | ORAL | 1 refills | Status: DC

## 2023-07-05 NOTE — Progress Notes (Signed)
BP 138/86   Pulse 94   Temp 98.4 F (36.9 C) (Oral)   Ht 6\' 1"  (1.854 m)   Wt 211 lb 6.4 oz (95.9 kg)   SpO2 97%   BMI 27.89 kg/m    Subjective:    Patient ID: Ricky Nordmann., male    DOB: [birth year], 42 y.o.   MRN: [id number]  HPI: Ricky Spiering. is a 42 y.o. male  Chief Complaint  Patient presents with   Anemia    No new concerns today    Medication Refill    Medications sent to [facility] have to be sent to [facility] due to noot having access to [facility] anymore    ANEMIA Anemia status: controlled Etiology of anemia: working to determine Duration of anemia treatment:  Compliance with treatment: excellent compliance Iron supplementation side effects: no Severity of anemia: mild Fatigue: yes Decreased exercise tolerance: no  Dyspnea on exertion: no Palpitations: yes Bleeding: no Pica: no  Relevant past medical, surgical, family and social history reviewed and updated as indicated. Interim medical history since our last visit reviewed. Allergies and medications reviewed and updated.  Review of Systems  Constitutional:  Positive for fatigue.  Respiratory:  Negative for shortness of breath.   Cardiovascular:  Positive for palpitations.    Per HPI unless specifically indicated above     Objective:    BP 138/86   Pulse 94   Temp 98.4 F (36.9 C) (Oral)   Ht 6\' 1"  (1.854 m)   Wt 211 lb 6.4 oz (95.9 kg)   SpO2 97%   BMI 27.89 kg/m   Wt Readings from Last 3 Encounters:  07/05/23 211 lb 6.4 oz (95.9 kg)  06/21/23 205 lb (93 kg)  02/27/23 207 lb (93.9 kg)    Physical Exam Vitals and nursing note reviewed.  Constitutional:      General: Ricky Anderson is not in acute distress.    Appearance: Normal appearance. Ricky Anderson is not ill-appearing, toxic-appearing or diaphoretic.  HENT:     Head: Normocephalic.     Right Ear: External ear normal.     Left Ear: External ear normal.     Nose: Nose normal. No congestion or rhinorrhea.     Mouth/Throat:     Mouth: Mucous  membranes are moist.  Eyes:     General:        Right eye: No discharge.        Left eye: No discharge.     Extraocular Movements: Extraocular movements intact.     Conjunctiva/sclera: Conjunctivae normal.     Pupils: Pupils are equal, round, and reactive to light.  Cardiovascular:     Rate and Rhythm: Normal rate and regular rhythm.     Heart sounds: No murmur heard. Pulmonary:     Effort: Pulmonary effort is normal. No respiratory distress.     Breath sounds: Normal breath sounds. No wheezing, rhonchi or rales.  Abdominal:     General: Abdomen is flat. Bowel sounds are normal.  Musculoskeletal:     Cervical back: Normal range of motion and neck supple.  Skin:    General: Skin is warm and dry.     Capillary Refill: Capillary refill takes less than 2 seconds.  Neurological:     General: No focal deficit present.     Mental Status: Ricky Anderson is alert and oriented to person, place, and time.  Psychiatric:        Mood and Affect: Mood normal.  Behavior: Behavior normal.        Thought Content: Thought content normal.        Judgment: Judgment normal.     Results for orders placed or performed in visit on 06/21/23  Microscopic Examination   Urine  Result Value Ref Range   WBC, UA None seen 0 - 5 /hpf   RBC, Urine 0-2 0 - 2 /hpf   Epithelial Cells (non renal) None seen 0 - 10 /hpf   Bacteria, UA None seen None seen/Few  TSH  Result Value Ref Range   TSH 0.990 0.450 - 4.500 uIU/mL  CBC with Differential/Platelet  Result Value Ref Range   WBC 7.5 3.4 - 10.8 x10E3/uL   RBC 5.89 (H) 4.14 - 5.80 x10E6/uL   Hemoglobin 13.7 13.0 - 17.7 g/dL   Hematocrit 91.4 78.2 - 51.0 %   MCV 76 (L) 79 - 97 fL   MCH 23.3 (L) 26.6 - 33.0 pg   MCHC 30.5 (L) 31.5 - 35.7 g/dL   RDW 95.6 (H) 21.3 - 08.6 %   Platelets 277 150 - 450 x10E3/uL   Neutrophils 51 Not Estab. %   Lymphs 34 Not Estab. %   Monocytes 11 Not Estab. %   Eos 3 Not Estab. %   Basos 1 Not Estab. %   Neutrophils Absolute 3.8  1.4 - 7.0 x10E3/uL   Lymphocytes Absolute 2.6 0.7 - 3.1 x10E3/uL   Monocytes Absolute 0.8 0.1 - 0.9 x10E3/uL   EOS (ABSOLUTE) 0.3 0.0 - 0.4 x10E3/uL   Basophils Absolute 0.1 0.0 - 0.2 x10E3/uL   Immature Granulocytes 0 Not Estab. %   Immature Grans (Abs) 0.0 0.0 - 0.1 x10E3/uL  Comprehensive metabolic panel  Result Value Ref Range   Glucose 94 70 - 99 mg/dL   BUN 7 6 - 24 mg/dL   Creatinine, Ser 5.78 0.76 - 1.27 mg/dL   eGFR 469 >62 XB/MWU/1.32   BUN/Creatinine Ratio 8 (L) 9 - 20   Sodium 141 134 - 144 mmol/L   Potassium 4.2 3.5 - 5.2 mmol/L   Chloride 105 96 - 106 mmol/L   CO2 21 20 - 29 mmol/L   Calcium 9.4 8.7 - 10.2 mg/dL   Total Protein 7.4 6.0 - 8.5 g/dL   Albumin 4.6 4.1 - 5.1 g/dL   Globulin, Total 2.8 1.5 - 4.5 g/dL   Bilirubin Total 0.5 0.0 - 1.2 mg/dL   Alkaline Phosphatase 93 44 - 121 IU/L   AST 40 0 - 40 IU/L   ALT 36 0 - 44 IU/L  Urinalysis, Routine w reflex microscopic  Result Value Ref Range   Specific Gravity, UA 1.010 1.005 - 1.030   pH, UA 6.0 5.0 - 7.5   Color, UA Yellow Yellow   Appearance Ur Clear Clear   Leukocytes,UA Negative Negative   Protein,UA Negative Negative/Trace   Glucose, UA Negative Negative   Ketones, UA Negative Negative   RBC, UA Trace (A) Negative   Bilirubin, UA Negative Negative   Urobilinogen, Ur 0.2 0.2 - 1.0 mg/dL   Nitrite, UA Negative Negative   Microscopic Examination See below:   Lipid Profile  Result Value Ref Range   Cholesterol, Total 157 100 - 199 mg/dL   Triglycerides 77 0 - 149 mg/dL   HDL 46 >44 mg/dL   VLDL Cholesterol Cal 15 5 - 40 mg/dL   LDL Chol Calc (NIH) 96 0 - 99 mg/dL   Chol/HDL Ratio 3.4 0.0 - 5.0 ratio  Assessment & Plan:   Problem List Items Addressed This Visit       Other   Anxiety   Relevant Medications   FLUoxetine (PROZAC) 20 MG tablet   Hyperlipidemia   Relevant Medications   atorvastatin (LIPITOR) 80 MG tablet   Other Visit Diagnoses     Anemia, unspecified type    -   Primary   Labs ordered at visit today.  Will await results.   Relevant Orders   Anemia Profile B        Follow up plan: No follow-ups on file.

## 2023-07-06 LAB — ANEMIA PROFILE B
Basophils Absolute: 0.1 10*3/uL (ref 0.0–0.2)
Basos: 1 %
EOS (ABSOLUTE): 0.3 10*3/uL (ref 0.0–0.4)
Eos: 3 %
Ferritin: 205 ng/mL (ref 30–400)
Folate: 8 ng/mL (ref >3.0)
Hematocrit: 41.5 % (ref 37.5–51.0)
Hemoglobin: 13.1 g/dL (ref 13.0–17.7)
Immature Grans (Abs): 0 10*3/uL (ref 0.0–0.1)
Immature Granulocytes: 0 %
Iron Saturation: 18 % (ref 15–55)
Iron: 61 ug/dL (ref 38–169)
Lymphocytes Absolute: 2.8 10*3/uL (ref 0.7–3.1)
Lymphs: 29 %
MCH: 23.5 pg — ABNORMAL LOW (ref 26.6–33.0)
MCHC: 31.6 g/dL (ref 31.5–35.7)
MCV: 74 fL — ABNORMAL LOW (ref 79–97)
Monocytes Absolute: 1.5 10*3/uL — ABNORMAL HIGH (ref 0.1–0.9)
Monocytes: 15 %
Neutrophils Absolute: 5.1 10*3/uL (ref 1.4–7.0)
Neutrophils: 52 %
Platelets: 272 10*3/uL (ref 150–450)
RBC: 5.58 x10E6/uL (ref 4.14–5.80)
RDW: 16.1 % — ABNORMAL HIGH (ref 11.6–15.4)
Retic Ct Pct: 1.5 % (ref 0.6–2.6)
Total Iron Binding Capacity: 343 ug/dL (ref 250–450)
UIBC: 282 ug/dL (ref 111–343)
Vitamin B-12: 789 pg/mL (ref 232–1245)
WBC: 9.8 10*3/uL (ref 3.4–10.8)

## 2023-07-17 ENCOUNTER — Ambulatory Visit: Payer: 59 | Admitting: Nurse Practitioner

## 2023-09-10 DIAGNOSIS — M19012 Primary osteoarthritis, left shoulder: Secondary | ICD-10-CM | POA: Diagnosis not present

## 2023-09-10 DIAGNOSIS — M25512 Pain in left shoulder: Secondary | ICD-10-CM | POA: Diagnosis not present

## 2023-09-10 DIAGNOSIS — M778 Other enthesopathies, not elsewhere classified: Secondary | ICD-10-CM | POA: Diagnosis not present

## 2023-09-10 DIAGNOSIS — R030 Elevated blood-pressure reading, without diagnosis of hypertension: Secondary | ICD-10-CM | POA: Diagnosis not present

## 2023-11-08 ENCOUNTER — Other Ambulatory Visit: Payer: Self-pay | Admitting: Nurse Practitioner

## 2023-11-09 NOTE — Telephone Encounter (Signed)
 Requested Prescriptions  Pending Prescriptions Disp Refills   levocetirizine (XYZAL) 5 MG tablet [Pharmacy Med Name: Levocetirizine Dihydrochloride 5 MG Oral Tablet] 180 tablet 0    Sig: Take 1 tablet by mouth twice daily     Ear, Nose, and Throat:  Antihistamines - levocetirizine dihydrochloride Passed - 11/09/2023  9:23 AM      Passed - Cr in normal range and within 360 days    Creatinine  Date Value Ref Range Status  11/10/2013 1.12 0.60 - 1.30 mg/dL Final   Creatinine, Ser  Date Value Ref Range Status  06/21/2023 0.93 0.76 - 1.27 mg/dL Final         Passed - eGFR is 10 or above and within 360 days    EGFR (African American)  Date Value Ref Range Status  11/10/2013 >60  Final   GFR calc Af Amer  Date Value Ref Range Status  09/17/2020 110 >59 mL/min/1.73 Final    Comment:    **In accordance with recommendations from the NKF-ASN Task force,**   [facility] is in the process of updating its eGFR calculation to the   2021 CKD-EPI creatinine equation that estimates kidney function   without a race variable.    EGFR (Non-African Amer.)  Date Value Ref Range Status  11/10/2013 >60  Final    Comment:    eGFR values <4mL/min/1.73 m2 may be an indication of chronic kidney disease (CKD). Calculated eGFR is useful in patients with stable renal function. The eGFR calculation will not be reliable in acutely ill patients when serum creatinine is changing rapidly. It is not useful in  patients on dialysis. The eGFR calculation may not be applicable to patients at the low and high extremes of body sizes, pregnant women, and vegetarians.    GFR calc non Af Amer  Date Value Ref Range Status  09/17/2020 96 >59 mL/min/1.73 Final   eGFR  Date Value Ref Range Status  06/21/2023 105 >59 mL/min/1.73 Final         Passed - Valid encounter within last 12 months    Recent Outpatient Visits           4 months ago Anemia, unspecified type   [place] [facility]  Larae Grooms, NP   4 months ago Annual physical exam   [facility] Larae Grooms, NP   12 months ago Hyperlipidemia, unspecified hyperlipidemia type   [place] [facility] Larae Grooms, NP   1 year ago Annual physical exam   [place] [facility] Larae Grooms, NP   2 years ago Anxiety   [place] [facility] Larae Grooms, NP       Future Appointments             In 1 month Larae Grooms, NP [facility] [facility]

## 2023-12-19 ENCOUNTER — Encounter: Payer: Self-pay | Admitting: Nurse Practitioner

## 2023-12-19 ENCOUNTER — Ambulatory Visit (INDEPENDENT_AMBULATORY_CARE_PROVIDER_SITE_OTHER): Payer: Self-pay | Admitting: Nurse Practitioner

## 2023-12-19 VITALS — BP 138/86 | HR 75 | Temp 98.2°F | Resp 17 | Ht 72.99 in | Wt 214.0 lb

## 2023-12-19 DIAGNOSIS — E785 Hyperlipidemia, unspecified: Secondary | ICD-10-CM

## 2023-12-19 DIAGNOSIS — D649 Anemia, unspecified: Secondary | ICD-10-CM | POA: Diagnosis not present

## 2023-12-19 DIAGNOSIS — F419 Anxiety disorder, unspecified: Secondary | ICD-10-CM | POA: Diagnosis not present

## 2023-12-19 MED ORDER — MONTELUKAST SODIUM 10 MG PO TABS: 10.0000 mg | ORAL_TABLET | Freq: Every day | ORAL | 3 refills | Status: DC

## 2023-12-19 MED ORDER — FLUOXETINE HCL 20 MG PO TABS: 20.0000 mg | ORAL_TABLET | Freq: Every day | ORAL | 1 refills | Status: DC

## 2023-12-19 MED ORDER — PANTOPRAZOLE SODIUM 40 MG PO TBEC: 40.0000 mg | DELAYED_RELEASE_TABLET | Freq: Every day | ORAL | 1 refills | Status: DC

## 2023-12-19 MED ORDER — ATORVASTATIN CALCIUM 80 MG PO TABS: 80.0000 mg | ORAL_TABLET | Freq: Every day | ORAL | 1 refills | Status: DC

## 2023-12-19 NOTE — Progress Notes (Signed)
 BP 138/86 (BP Location: Right Arm, Patient Position: Sitting, Cuff Size: Large)   Pulse 75   Temp 98.2 F (36.8 C) (Oral)   Resp 17   Ht 6' 0.99" (1.854 m)   Wt 214 lb (97.1 kg)   SpO2 98%   BMI 28.24 kg/m    Subjective:    Patient ID: Ricky Curio., male    DOB: Sep 01, 1980, 43 y.o.   MRN: [id number]  HPI: Ricky Finamore. is a 43 y.o. male  Chief Complaint  Patient presents with   Follow-up   Anxiety    Going well recently   Cramps in arms     States he has been having off and on forearm cramps for awhile but the last month more frequent. Might be partly dehydrated.    Allergies    Needs something that insurance will cover.    HYPERLIPIDEMIA He is tolerating the atorvastatin  well.   Hyperlipidemia status: excellent compliance Satisfied with current treatment?  yes Side effects:  no Medication compliance: excellent compliance Past cholesterol meds: Atorvastatin  Supplements: none Aspirin:  no The 10-year ASCVD risk score (Arnett DK, et al., 2019) is: 5.7%   Values used to calculate the score:     Age: 48 years     Sex: Male     Is Non-Hispanic African American: Yes     Diabetic: No     Tobacco smoker: Yes     Systolic Blood Pressure: 128 mmHg     Is BP treated: No     HDL Cholesterol: 43 mg/dL     Total Cholesterol: 150 mg/dL Chest pain:  no Coronary artery disease:  no Family history CAD:  no Family history early CAD:  no  GERD Patient states he has been doing better with taking the pantoprazole  in the morning.  GERD control status: controlled Satisfied with current treatment? yes   ANXIETY Patient states he is doing well.  Denies concerns at visit today.  Feels like the Fluoxetine  is working well for him. He feels like this is a good dos for him.  Would like to continue with the medication.  Denies SI.   ANEMIA Anemia status: controlled Etiology of anemia: working to determine Duration of anemia treatment:  Compliance with treatment:  excellent compliance Iron supplementation side effects: no Severity of anemia: mild Fatigue: yes Decreased exercise tolerance: no  Dyspnea on exertion: no Palpitations: yes Bleeding: no Pica: no Relevant past medical, surgical, family and social history reviewed and updated as indicated. Interim medical history since our last visit reviewed. Allergies and medications reviewed and updated.  Review of Systems  Constitutional:  Positive for fatigue.  Respiratory:  Negative for shortness of breath.   Gastrointestinal:        Reflux  Musculoskeletal:        Cramping  Psychiatric/Behavioral:  The patient is nervous/anxious.     Per HPI unless specifically indicated above     Objective:    BP 138/86 (BP Location: Right Arm, Patient Position: Sitting, Cuff Size: Large)   Pulse 75   Temp 98.2 F (36.8 C) (Oral)   Resp 17   Ht 6' 0.99" (1.854 m)   Wt 214 lb (97.1 kg)   SpO2 98%   BMI 28.24 kg/m   Wt Readings from Last 3 Encounters:  12/19/23 214 lb (97.1 kg)  07/05/23 211 lb 6.4 oz (95.9 kg)  06/21/23 205 lb (93 kg)    Physical Exam Vitals and nursing note reviewed.  Constitutional:  General: He is not in acute distress.    Appearance: Normal appearance. He is not ill-appearing, toxic-appearing or diaphoretic.  HENT:     Head: Normocephalic.     Right Ear: External ear normal.     Left Ear: External ear normal.     Nose: Nose normal. No congestion or rhinorrhea.     Mouth/Throat:     Mouth: Mucous membranes are moist.  Eyes:     General:        Right eye: No discharge.        Left eye: No discharge.     Extraocular Movements: Extraocular movements intact.     Conjunctiva/sclera: Conjunctivae normal.     Pupils: Pupils are equal, round, and reactive to light.  Cardiovascular:     Rate and Rhythm: Normal rate and regular rhythm.     Heart sounds: No murmur heard. Pulmonary:     Effort: Pulmonary effort is normal. No respiratory distress.     Breath sounds:  Normal breath sounds. No wheezing, rhonchi or rales.  Abdominal:     General: Abdomen is flat. Bowel sounds are normal.  Musculoskeletal:     Cervical back: Normal range of motion and neck supple.  Skin:    General: Skin is warm and dry.     Capillary Refill: Capillary refill takes less than 2 seconds.  Neurological:     General: No focal deficit present.     Mental Status: He is alert and oriented to person, place, and time.  Psychiatric:        Mood and Affect: Mood normal.        Behavior: Behavior normal.        Thought Content: Thought content normal.        Judgment: Judgment normal.     Results for orders placed or performed in visit on 07/05/23  Anemia Profile B   Collection Time: 07/05/23  3:30 PM  Result Value Ref Range   Total Iron Binding Capacity 343 250 - 450 ug/dL   UIBC 027 253 - 664 ug/dL   Iron 61 38 - 403 ug/dL   Iron Saturation 18 15 - 55 %   Ferritin 205 30 - 400 ng/mL   Vitamin B-12 789 232 - 1,245 pg/mL   Folate 8.0 >3.0 ng/mL   WBC 9.8 3.4 - 10.8 x10E3/uL   RBC 5.58 4.14 - 5.80 x10E6/uL   Hemoglobin 13.1 13.0 - 17.7 g/dL   Hematocrit 47.4 25.9 - 51.0 %   MCV 74 (L) 79 - 97 fL   MCH 23.5 (L) 26.6 - 33.0 pg   MCHC 31.6 31.5 - 35.7 g/dL   RDW 56.3 (H) 87.5 - 64.3 %   Platelets 272 150 - 450 x10E3/uL   Neutrophils 52 Not Estab. %   Lymphs 29 Not Estab. %   Monocytes 15 Not Estab. %   Eos 3 Not Estab. %   Basos 1 Not Estab. %   Neutrophils Absolute 5.1 1.4 - 7.0 x10E3/uL   Lymphocytes Absolute 2.8 0.7 - 3.1 x10E3/uL   Monocytes Absolute 1.5 (H) 0.1 - 0.9 x10E3/uL   EOS (ABSOLUTE) 0.3 0.0 - 0.4 x10E3/uL   Basophils Absolute 0.1 0.0 - 0.2 x10E3/uL   Immature Granulocytes 0 Not Estab. %   Immature Grans (Abs) 0.0 0.0 - 0.1 x10E3/uL   Retic Ct Pct 1.5 0.6 - 2.6 %      Assessment & Plan:   Problem List Items Addressed This Visit  Other   Anxiety   Chronic.  Controlled.  Continue with current medication regimen of Fluoxetine  20mg  daily.   Refills sent today.  Labs ordered today.  Return to clinic in 6 months for reevaluation.  Call sooner if concerns arise.       Relevant Medications   FLUoxetine  (PROZAC ) 20 MG tablet   Other Relevant Orders   Comp Met (CMET)   Hyperlipidemia - Primary   Chronic.  Controlled.  Continue with current medication regimen of Atorvastatin  80mg  daily.  Refills sent today.  Labs ordered today.  Return to clinic in 6 months for reevaluation.  Call sooner if concerns arise.       Relevant Medications   atorvastatin  (LIPITOR) 80 MG tablet   Other Relevant Orders   Lipid Profile   Other Visit Diagnoses       Anemia, unspecified type       Labs ordered at visit today. Will make recommendations based on lab results.   Relevant Orders   CBC w/Diff        Follow up plan: No follow-ups on file.

## 2023-12-19 NOTE — Assessment & Plan Note (Signed)
Chronic.  Controlled.  Continue with current medication regimen of Atorvastatin 80mg daily. Refills sent today.  Labs ordered today.  Return to clinic in 6 months for reevaluation.  Call sooner if concerns arise.   

## 2023-12-19 NOTE — Assessment & Plan Note (Signed)
 Chronic.  Controlled.  Continue with current medication regimen of Fluoxetine 20mg  daily.  Refills sent today.  Labs ordered today.  Return to clinic in 6 months for reevaluation.  Call sooner if concerns arise.

## 2023-12-20 ENCOUNTER — Encounter: Payer: Self-pay | Admitting: Nurse Practitioner

## 2023-12-20 LAB — CBC WITH DIFFERENTIAL/PLATELET
Basophils Absolute: 0.1 10*3/uL (ref 0.0–0.2)
Basos: 1 %
EOS (ABSOLUTE): 0.3 10*3/uL (ref 0.0–0.4)
Eos: 3 %
Hematocrit: 43.7 % (ref 37.5–51.0)
Hemoglobin: 13.7 g/dL (ref 13.0–17.7)
Immature Grans (Abs): 0 10*3/uL (ref 0.0–0.1)
Immature Granulocytes: 0 %
Lymphocytes Absolute: 2.5 10*3/uL (ref 0.7–3.1)
Lymphs: 31 %
MCH: 24 pg — ABNORMAL LOW (ref 26.6–33.0)
MCHC: 31.4 g/dL — ABNORMAL LOW (ref 31.5–35.7)
MCV: 77 fL — ABNORMAL LOW (ref 79–97)
Monocytes Absolute: 1 10*3/uL — ABNORMAL HIGH (ref 0.1–0.9)
Monocytes: 13 %
Neutrophils Absolute: 4.1 10*3/uL (ref 1.4–7.0)
Neutrophils: 52 %
Platelets: 263 10*3/uL (ref 150–450)
RBC: 5.71 x10E6/uL (ref 4.14–5.80)
RDW: 16.6 % — ABNORMAL HIGH (ref 11.6–15.4)
WBC: 7.8 10*3/uL (ref 3.4–10.8)

## 2023-12-20 LAB — COMPREHENSIVE METABOLIC PANEL WITH GFR
ALT: 46 IU/L — ABNORMAL HIGH (ref 0–44)
AST: 46 IU/L — ABNORMAL HIGH (ref 0–40)
Albumin: 4.5 g/dL (ref 4.1–5.1)
Alkaline Phosphatase: 95 IU/L (ref 44–121)
BUN/Creatinine Ratio: 13 (ref 9–20)
BUN: 11 mg/dL (ref 6–24)
Bilirubin Total: 0.5 mg/dL (ref 0.0–1.2)
CO2: 21 mmol/L (ref 20–29)
Calcium: 9.3 mg/dL (ref 8.7–10.2)
Chloride: 105 mmol/L (ref 96–106)
Creatinine, Ser: 0.87 mg/dL (ref 0.76–1.27)
Globulin, Total: 2.7 g/dL (ref 1.5–4.5)
Glucose: 86 mg/dL (ref 70–99)
Potassium: 4 mmol/L (ref 3.5–5.2)
Sodium: 141 mmol/L (ref 134–144)
Total Protein: 7.2 g/dL (ref 6.0–8.5)
eGFR: 110 mL/min/{1.73_m2} (ref >59)

## 2023-12-20 LAB — LIPID PANEL
Chol/HDL Ratio: 4.3 ratio (ref 0.0–5.0)
Cholesterol, Total: 194 mg/dL (ref 100–199)
HDL: 45 mg/dL (ref >39)
LDL Chol Calc (NIH): 129 mg/dL — ABNORMAL HIGH (ref 0–99)
Triglycerides: 113 mg/dL (ref 0–149)
VLDL Cholesterol Cal: 20 mg/dL (ref 5–40)

## 2024-01-24 ENCOUNTER — Encounter: Payer: Self-pay | Admitting: Nurse Practitioner

## 2024-01-24 MED ORDER — LEVOCETIRIZINE DIHYDROCHLORIDE 5 MG PO TABS: 5.0000 mg | ORAL_TABLET | Freq: Two times a day (BID) | ORAL | 1 refills | Status: AC

## 2024-02-04 NOTE — Telephone Encounter (Signed)
 Any chance of running a test claim on other meds in the same class to see what his insurance might cover instead?  If not I totally understand but figure I'd check.   Thank you, Ricky Anderson

## 2024-02-05 ENCOUNTER — Other Ambulatory Visit (HOSPITAL_COMMUNITY): Payer: Self-pay

## 2024-02-07 DIAGNOSIS — L237 Allergic contact dermatitis due to plants, except food: Secondary | ICD-10-CM | POA: Diagnosis not present

## 2024-03-07 DIAGNOSIS — H1032 Unspecified acute conjunctivitis, left eye: Secondary | ICD-10-CM | POA: Diagnosis not present

## 2024-03-07 DIAGNOSIS — L237 Allergic contact dermatitis due to plants, except food: Secondary | ICD-10-CM | POA: Diagnosis not present

## 2024-06-02 DIAGNOSIS — R42 Dizziness and giddiness: Secondary | ICD-10-CM | POA: Diagnosis not present

## 2024-06-02 DIAGNOSIS — D649 Anemia, unspecified: Secondary | ICD-10-CM | POA: Diagnosis not present

## 2024-06-02 DIAGNOSIS — R002 Palpitations: Secondary | ICD-10-CM | POA: Diagnosis not present

## 2024-06-02 DIAGNOSIS — E785 Hyperlipidemia, unspecified: Secondary | ICD-10-CM | POA: Diagnosis not present

## 2024-06-02 DIAGNOSIS — F17210 Nicotine dependence, cigarettes, uncomplicated: Secondary | ICD-10-CM | POA: Diagnosis not present

## 2024-06-10 DIAGNOSIS — D508 Other iron deficiency anemias: Secondary | ICD-10-CM | POA: Diagnosis not present

## 2024-06-10 DIAGNOSIS — R002 Palpitations: Secondary | ICD-10-CM | POA: Diagnosis not present

## 2024-06-10 DIAGNOSIS — E7849 Other hyperlipidemia: Secondary | ICD-10-CM | POA: Diagnosis not present

## 2024-06-10 DIAGNOSIS — Z720 Tobacco use: Secondary | ICD-10-CM | POA: Diagnosis not present

## 2024-06-10 DIAGNOSIS — R9431 Abnormal electrocardiogram [ECG] [EKG]: Secondary | ICD-10-CM | POA: Diagnosis not present

## 2024-06-20 ENCOUNTER — Ambulatory Visit: Admitting: Nurse Practitioner

## 2024-06-23 DIAGNOSIS — I517 Cardiomegaly: Secondary | ICD-10-CM | POA: Diagnosis not present

## 2024-06-27 DIAGNOSIS — R002 Palpitations: Secondary | ICD-10-CM | POA: Diagnosis not present

## 2024-06-27 DIAGNOSIS — R9431 Abnormal electrocardiogram [ECG] [EKG]: Secondary | ICD-10-CM | POA: Diagnosis not present

## 2024-06-30 DIAGNOSIS — D508 Other iron deficiency anemias: Secondary | ICD-10-CM | POA: Diagnosis not present

## 2024-06-30 DIAGNOSIS — R002 Palpitations: Secondary | ICD-10-CM | POA: Diagnosis not present

## 2024-06-30 DIAGNOSIS — Z720 Tobacco use: Secondary | ICD-10-CM | POA: Diagnosis not present

## 2024-06-30 DIAGNOSIS — R9431 Abnormal electrocardiogram [ECG] [EKG]: Secondary | ICD-10-CM | POA: Diagnosis not present

## 2024-07-10 ENCOUNTER — Ambulatory Visit: Admitting: Nurse Practitioner

## 2024-07-22 DIAGNOSIS — I119 Hypertensive heart disease without heart failure: Secondary | ICD-10-CM | POA: Diagnosis not present

## 2024-07-22 DIAGNOSIS — D508 Other iron deficiency anemias: Secondary | ICD-10-CM | POA: Diagnosis not present

## 2024-07-22 DIAGNOSIS — F17210 Nicotine dependence, cigarettes, uncomplicated: Secondary | ICD-10-CM | POA: Diagnosis not present

## 2024-07-22 DIAGNOSIS — R9431 Abnormal electrocardiogram [ECG] [EKG]: Secondary | ICD-10-CM | POA: Diagnosis not present

## 2024-08-06 ENCOUNTER — Encounter: Payer: Self-pay | Admitting: Nurse Practitioner

## 2024-08-06 ENCOUNTER — Ambulatory Visit: Admitting: Nurse Practitioner

## 2024-08-06 VITALS — BP 134/88 | HR 102 | Temp 98.8°F | Ht 72.9 in | Wt 206.6 lb

## 2024-08-06 DIAGNOSIS — K219 Gastro-esophageal reflux disease without esophagitis: Secondary | ICD-10-CM

## 2024-08-06 DIAGNOSIS — Z0000 Encounter for general adult medical examination without abnormal findings: Secondary | ICD-10-CM | POA: Diagnosis not present

## 2024-08-06 DIAGNOSIS — F419 Anxiety disorder, unspecified: Secondary | ICD-10-CM

## 2024-08-06 DIAGNOSIS — G589 Mononeuropathy, unspecified: Secondary | ICD-10-CM | POA: Diagnosis not present

## 2024-08-06 DIAGNOSIS — E785 Hyperlipidemia, unspecified: Secondary | ICD-10-CM

## 2024-08-06 DIAGNOSIS — D649 Anemia, unspecified: Secondary | ICD-10-CM

## 2024-08-06 MED ORDER — ATORVASTATIN CALCIUM 80 MG PO TABS: 80.0000 mg | ORAL_TABLET | Freq: Every day | ORAL | 1 refills | Status: AC

## 2024-08-06 MED ORDER — FLUOXETINE HCL 20 MG PO TABS: 20.0000 mg | ORAL_TABLET | Freq: Every day | ORAL | 1 refills | Status: AC

## 2024-08-06 MED ORDER — PANTOPRAZOLE SODIUM 40 MG PO TBEC: 40.0000 mg | DELAYED_RELEASE_TABLET | Freq: Every day | ORAL | 1 refills | Status: AC

## 2024-08-06 NOTE — Progress Notes (Unsigned)
 BP 134/88   Pulse (!) 102   Temp 98.8 F (37.1 C) (Oral)   Ht 6' 0.9 (1.852 m)   Wt 206 lb 9.6 oz (93.7 kg)   SpO2 97%   BMI 27.33 kg/m    Subjective:    Patient ID: Ricky Blair Raddle., male    DOB: 1981-08-07, 43 y.o.   MRN: [id number]  HPI: Ricky Hanover. is a 43 y.o. male presenting on 08/06/2024 for comprehensive medical examination. Current medical complaints include:none  He currently lives with: Interim Problems from his last visit: no  HYPERTENSION without Chronic Kidney Disease Was seen in the ER in November for palpations.  He feels like they have been better.  Seen by Cardiology.  Hypertension status: controlled  Satisfied with current treatment? no Duration of hypertension: years BP monitoring frequency:  not checking BP range:  BP medication side effects:  no Medication compliance: excellent compliance Previous BP meds:HCTZ and losartan (cozaar) Aspirin: no Recurrent headaches: no Visual changes: no Palpitations: no Dyspnea: no Chest pain: no Lower extremity edema: no Dizzy/lightheaded: no   GERD Patient states he has been taking the pantoprazole  at night and that is helping his symptoms.   GERD control status: controlled Satisfied with current treatment? yes  Patient states he has been having a numbness feeling from his    ANXIETY Patient states he is doing well.  Denies concerns at visit today.  Feels like the Fluoxetine  is working well for him. He feels like this is a good dos for him.  Would like to continue with the medication.  Denies SI.   Depression Screen done today and results listed below:     08/06/2024    4:12 PM 12/19/2023    8:25 AM 07/05/2023    3:24 PM 06/21/2023    9:44 AM 11/13/2022    8:12 AM  Depression screen PHQ 2/9  Decreased Interest 0 0 0 0 0  Down, Depressed, Hopeless 0 0 0 0 0  PHQ - 2 Score 0 0 0 0 0  Altered sleeping 0 0 0 0 0  Tired, decreased energy 0 0 0 0 0  Change in appetite 0 0 0 0 0  Feeling bad  or failure about yourself  0 0 0 0 0  Trouble concentrating 0 0 0 0 0  Moving slowly or fidgety/restless 0 0 0 0 0  Suicidal thoughts 0 0 0 0 0  PHQ-9 Score 0 0  0  0  0   Difficult doing work/chores Not difficult at all   Not difficult at all Not difficult at all     Data saved with a previous flowsheet row definition    The patient does not have a history of falls. I did complete a risk assessment for falls. A plan of care for falls was documented.   Past Medical History:  Past Medical History:  Diagnosis Date   Seasonal allergies     Surgical History:  History reviewed. No pertinent surgical history.  Medications:  Current Outpatient Medications on File Prior to Visit  Medication Sig   fluticasone  (FLONASE ) 50 MCG/ACT nasal spray Place 2 sprays into both nostrils 2 (two) times daily.   levocetirizine (XYZAL ) 5 MG tablet Take 1 tablet (5 mg total) by mouth 2 (two) times daily.   losartan-hydrochlorothiazide (HYZAAR) 50-12.5 MG tablet Take 1 tablet by mouth daily.   No current facility-administered medications on file prior to visit.    Allergies:  No Known Allergies  Social  History:  Social History   Socioeconomic History   Marital status: Single    Spouse name: Not on file   Number of children: Not on file   Years of education: Not on file   Highest education level: 12th grade  Occupational History   Not on file  Tobacco Use   Smoking status: Every Day    Current packs/day: 0.50    Types: Cigarettes   Smokeless tobacco: Never  Vaping Use   Vaping status: Never Used  Substance and Sexual Activity   Alcohol use: Yes    Alcohol/week: 2.0 standard drinks of alcohol    Types: 2 Cans of beer per week    Comment: per day, liquor on the weekends   Drug use: No   Sexual activity: Yes  Other Topics Concern   Not on file  Social History Narrative   Not on file   Social Drivers of Health   Tobacco Use: High Risk (08/06/2024)   Patient History    Smoking  Tobacco Use: Every Day    Smokeless Tobacco Use: Never    Passive Exposure: Not on file  Financial Resource Strain: Low Risk (07/05/2023)   Overall Financial Resource Strain (CARDIA)    Difficulty of Paying Living Expenses: Not hard at all  Food Insecurity: No Food Insecurity (07/05/2023)   Hunger Vital Sign    Worried About Running Out of Food in the Last Year: Never true    Ran Out of Food in the Last Year: Never true  Transportation Needs: No Transportation Needs (07/05/2023)   PRAPARE - Administrator, Civil Service (Medical): No    Lack of Transportation (Non-Medical): No  Physical Activity: Sufficiently Active (07/05/2023)   Exercise Vital Sign    Days of Exercise per Week: 5 days    Minutes of Exercise per Session: 30 min  Stress: No Stress Concern Present (07/05/2023)   [employer] of Occupational Health - Occupational Stress Questionnaire    Feeling of Stress : Not at all  Social Connections: Unknown (07/05/2023)   Social Connection and Isolation Panel    Frequency of Communication with Friends and Family: More than three times a week    Frequency of Social Gatherings with Friends and Family: More than three times a week    Attends Religious Services: Patient declined    Active Member of Clubs or Organizations: No    Attends Engineer, Structural: Not on file    Marital Status: Living with partner  Intimate Partner Violence: Not on file  Depression (PHQ2-9): Low Risk (08/06/2024)   Depression (PHQ2-9)    PHQ-2 Score: 0  Alcohol Screen: Low Risk (07/05/2023)   Alcohol Screen    Last Alcohol Screening Score (AUDIT): 6  Housing: Low Risk (07/05/2023)   Housing    Last Housing Risk Score: 0  Utilities: Not on file  Health Literacy: Not on file   Social History   Tobacco Use  Smoking Status Every Day   Current packs/day: 0.50   Types: Cigarettes  Smokeless Tobacco Never   Social History   Substance and Sexual Activity  Alcohol Use Yes    Alcohol/week: 2.0 standard drinks of alcohol   Types: 2 Cans of beer per week   Comment: per day, liquor on the weekends    Family History:  Family History  Problem Relation Age of Onset   Cancer Mother    Heart disease Father    Kidney disease Maternal Grandmother  Past medical history, surgical history, medications, allergies, family history and social history reviewed with patient today and changes made to appropriate areas of the chart.   Review of Systems  Gastrointestinal:  Positive for heartburn.  Musculoskeletal:        Numbness and tingling down his arm and neck  Psychiatric/Behavioral:  Negative for suicidal ideas. The patient is nervous/anxious.    All other ROS negative except what is listed above and in the HPI.      Objective:    BP 134/88   Pulse (!) 102   Temp 98.8 F (37.1 C) (Oral)   Ht 6' 0.9 (1.852 m)   Wt 206 lb 9.6 oz (93.7 kg)   SpO2 97%   BMI 27.33 kg/m   Wt Readings from Last 3 Encounters:  08/06/24 206 lb 9.6 oz (93.7 kg)  12/19/23 214 lb (97.1 kg)  07/05/23 211 lb 6.4 oz (95.9 kg)    Physical Exam Vitals and nursing note reviewed.  Constitutional:      General: He is not in acute distress.    Appearance: Normal appearance. He is normal weight. He is not ill-appearing, toxic-appearing or diaphoretic.  HENT:     Head: Normocephalic.     Right Ear: Tympanic membrane, ear canal and external ear normal.     Left Ear: Tympanic membrane, ear canal and external ear normal.     Nose: Nose normal. No congestion or rhinorrhea.     Mouth/Throat:     Mouth: Mucous membranes are moist.  Eyes:     General:        Right eye: No discharge.        Left eye: No discharge.     Extraocular Movements: Extraocular movements intact.     Conjunctiva/sclera: Conjunctivae normal.     Pupils: Pupils are equal, round, and reactive to light.  Cardiovascular:     Rate and Rhythm: Normal rate and regular rhythm.     Heart sounds: No murmur  heard. Pulmonary:     Effort: Pulmonary effort is normal. No respiratory distress.     Breath sounds: Normal breath sounds. No wheezing, rhonchi or rales.  Abdominal:     General: Abdomen is flat. Bowel sounds are normal. There is no distension.     Palpations: Abdomen is soft.     Tenderness: There is no abdominal tenderness. There is no guarding.  Musculoskeletal:     Cervical back: Normal range of motion and neck supple.  Skin:    General: Skin is warm and dry.     Capillary Refill: Capillary refill takes less than 2 seconds.  Neurological:     General: No focal deficit present.     Mental Status: He is alert and oriented to person, place, and time.     Cranial Nerves: No cranial nerve deficit.     Motor: No weakness.     Deep Tendon Reflexes: Reflexes normal.  Psychiatric:        Mood and Affect: Mood normal.        Behavior: Behavior normal.        Thought Content: Thought content normal.        Judgment: Judgment normal.     Results for orders placed or performed in visit on 08/06/24  Comprehensive metabolic panel with GFR   Collection Time: 08/06/24  4:28 PM  Result Value Ref Range   Glucose 89 70 - 99 mg/dL   BUN 11 6 - 24 mg/dL   Creatinine,  Ser 0.99 0.76 - 1.27 mg/dL   eGFR 97 >40 fO/fpw/8.26   BUN/Creatinine Ratio 11 9 - 20   Sodium 140 134 - 144 mmol/L   Potassium 4.0 3.5 - 5.2 mmol/L   Chloride 104 96 - 106 mmol/L   CO2 21 20 - 29 mmol/L   Calcium  9.7 8.7 - 10.2 mg/dL   Total Protein 7.8 6.0 - 8.5 g/dL   Albumin 4.7 4.1 - 5.1 g/dL   Globulin, Total 3.1 1.5 - 4.5 g/dL   Bilirubin Total 0.4 0.0 - 1.2 mg/dL   Alkaline Phosphatase 99 47 - 123 IU/L   AST 39 0 - 40 IU/L   ALT 27 0 - 44 IU/L  Lipid panel   Collection Time: 08/06/24  4:28 PM  Result Value Ref Range   Cholesterol, Total 201 (H) 100 - 199 mg/dL   Triglycerides 862 0 - 149 mg/dL   HDL 45 >60 mg/dL   VLDL Cholesterol Cal 25 5 - 40 mg/dL   LDL Chol Calc (NIH) 868 (H) 0 - 99 mg/dL   Chol/HDL  Ratio 4.5 0.0 - 5.0 ratio  Anemia Profile B   Collection Time: 08/06/24  4:28 PM  Result Value Ref Range   Total Iron Binding Capacity 364 250 - 450 ug/dL   UIBC 701 888 - 656 ug/dL   Iron 66 38 - 830 ug/dL   Iron Saturation 18 15 - 55 %   Ferritin 189 30 - 400 ng/mL   Vitamin B-12 919 232 - 1,245 pg/mL   Folate 7.3 >3.0 ng/mL   WBC 10.1 3.4 - 10.8 x10E3/uL   RBC 5.84 (H) 4.14 - 5.80 x10E6/uL   Hemoglobin 13.8 13.0 - 17.7 g/dL   Hematocrit 54.7 62.4 - 51.0 %   MCV 77 (L) 79 - 97 fL   MCH 23.6 (L) 26.6 - 33.0 pg   MCHC 30.5 (L) 31.5 - 35.7 g/dL   RDW 83.7 (H) 88.3 - 84.5 %   Platelets 335 150 - 450 x10E3/uL   Neutrophils 56 Not Estab. %   Lymphs 30 Not Estab. %   Monocytes 10 Not Estab. %   Eos 3 Not Estab. %   Basos 1 Not Estab. %   Neutrophils Absolute 5.6 1.4 - 7.0 x10E3/uL   Lymphocytes Absolute 3.0 0.7 - 3.1 x10E3/uL   Monocytes Absolute 1.0 (H) 0.1 - 0.9 x10E3/uL   EOS (ABSOLUTE) 0.3 0.0 - 0.4 x10E3/uL   Basophils Absolute 0.1 0.0 - 0.2 x10E3/uL   Immature Granulocytes 0 Not Estab. %   Immature Grans (Abs) 0.0 0.0 - 0.1 x10E3/uL   Retic Ct Pct 1.4 0.6 - 2.6 %  TSH   Collection Time: 08/06/24  4:28 PM  Result Value Ref Range   TSH 1.270 0.450 - 4.500 uIU/mL      Assessment & Plan:   Problem List Items Addressed This Visit       Digestive   GERD (gastroesophageal reflux disease)   Chronic. Controlled with pantoprazole  nightly.         Relevant Medications   pantoprazole  (PROTONIX ) 40 MG tablet     Other   Anxiety   Chronic.  Controlled.  Continue with current medication regimen of Fluoxetine  20mg  daily.  Refills sent today.  Labs ordered today.  Return to clinic in 6 months for reevaluation.  Call sooner if concerns arise.       Relevant Medications   FLUoxetine  (PROZAC ) 20 MG tablet   Hyperlipidemia   Chronic.  Controlled.  Continue with current medication regimen of Atorvastatin  80mg  daily.  Refills sent today.  Labs ordered today.  Return to clinic in  6 months for reevaluation.  Call sooner if concerns arise.       Relevant Medications   losartan-hydrochlorothiazide (HYZAAR) 50-12.5 MG tablet   atorvastatin  (LIPITOR) 80 MG tablet   Other Relevant Orders   Lipid panel (Completed)   Other Visit Diagnoses       Annual physical exam    -  Primary   Health maintenance reviewed during visit today.  Labs ordered.  Vaccines reviewed.   Relevant Orders   Comprehensive metabolic panel with GFR (Completed)   Lipid panel (Completed)   Anemia Profile B (Completed)   TSH (Completed)     Anemia, unspecified type       Labs reviewed from ER visit. Additional labs ordered at visit today. Will make recommendations based on results.   Relevant Orders   Anemia Profile B (Completed)   Ambulatory referral to Gastroenterology     Pinched nerve in neck       Referral placed for Chiropractor.   Relevant Orders   Ambulatory referral to Chiropractic        Discussed aspirin prophylaxis for myocardial infarction prevention and decision was it was not indicated  LABORATORY TESTING:  Health maintenance labs ordered today as discussed above.    IMMUNIZATIONS:   - Tdap: Tetanus vaccination status reviewed: last tetanus booster within 10 years. - Influenza: Refused - Pneumovax: Not applicable - Prevnar: Not applicable - COVID: Not applicable - HPV: Not applicable - Shingrix vaccine: Not applicable  SCREENING: - Colonoscopy: Not applicable  Discussed with patient purpose of the colonoscopy is to detect colon cancer at curable precancerous or early stages   - AAA Screening: Not applicable  -Hearing Test: Not applicable  -Spirometry: Not applicable   PATIENT COUNSELING:    Sexuality: Discussed sexually transmitted diseases, partner selection, use of condoms, avoidance of unintended pregnancy  and contraceptive alternatives.   Advised to avoid cigarette smoking.  I discussed with the patient that most people either abstain from alcohol or  drink within safe limits (<=14/week and <=4 drinks/occasion for males, <=7/weeks and <= 3 drinks/occasion for females) and that the risk for alcohol disorders and other health effects rises proportionally with the number of drinks per week and how often a drinker exceeds daily limits.  Discussed cessation/primary prevention of drug use and availability of treatment for abuse.   Diet: Encouraged to adjust caloric intake to maintain  or achieve ideal body weight, to reduce intake of dietary saturated fat and total fat, to limit sodium intake by avoiding high sodium foods and not adding table salt, and to maintain adequate dietary potassium and calcium  preferably from fresh fruits, vegetables, and low-fat dairy products.    stressed the importance of regular exercise  Injury prevention: Discussed safety belts, safety helmets, smoke detector, smoking near bedding or upholstery.   Dental health: Discussed importance of regular tooth brushing, flossing, and dental visits.   Follow up plan: NEXT PREVENTATIVE PHYSICAL DUE IN 1 YEAR. Return in about 6 months (around 02/04/2025) for HTN, HLD, DM2 FU.

## 2024-08-07 ENCOUNTER — Ambulatory Visit: Payer: Self-pay | Admitting: Nurse Practitioner

## 2024-08-07 LAB — ANEMIA PROFILE B
Basophils Absolute: 0.1 x10E3/uL (ref 0.0–0.2)
Basos: 1 %
EOS (ABSOLUTE): 0.3 x10E3/uL (ref 0.0–0.4)
Eos: 3 %
Ferritin: 189 ng/mL (ref 30–400)
Folate: 7.3 ng/mL (ref >3.0)
Hematocrit: 45.2 % (ref 37.5–51.0)
Hemoglobin: 13.8 g/dL (ref 13.0–17.7)
Immature Grans (Abs): 0 x10E3/uL (ref 0.0–0.1)
Immature Granulocytes: 0 %
Iron Saturation: 18 % (ref 15–55)
Iron: 66 ug/dL (ref 38–169)
Lymphocytes Absolute: 3 x10E3/uL (ref 0.7–3.1)
Lymphs: 30 %
MCH: 23.6 pg — ABNORMAL LOW (ref 26.6–33.0)
MCHC: 30.5 g/dL — ABNORMAL LOW (ref 31.5–35.7)
MCV: 77 fL — ABNORMAL LOW (ref 79–97)
Monocytes Absolute: 1 x10E3/uL — ABNORMAL HIGH (ref 0.1–0.9)
Monocytes: 10 %
Neutrophils Absolute: 5.6 x10E3/uL (ref 1.4–7.0)
Neutrophils: 56 %
Platelets: 335 x10E3/uL (ref 150–450)
RBC: 5.84 x10E6/uL — ABNORMAL HIGH (ref 4.14–5.80)
RDW: 16.2 % — ABNORMAL HIGH (ref 11.6–15.4)
Retic Ct Pct: 1.4 % (ref 0.6–2.6)
Total Iron Binding Capacity: 364 ug/dL (ref 250–450)
UIBC: 298 ug/dL (ref 111–343)
Vitamin B-12: 919 pg/mL (ref 232–1245)
WBC: 10.1 x10E3/uL (ref 3.4–10.8)

## 2024-08-07 LAB — COMPREHENSIVE METABOLIC PANEL WITH GFR
ALT: 27 IU/L (ref 0–44)
AST: 39 IU/L (ref 0–40)
Albumin: 4.7 g/dL (ref 4.1–5.1)
Alkaline Phosphatase: 99 IU/L (ref 47–123)
BUN/Creatinine Ratio: 11 (ref 9–20)
BUN: 11 mg/dL (ref 6–24)
Bilirubin Total: 0.4 mg/dL (ref 0.0–1.2)
CO2: 21 mmol/L (ref 20–29)
Calcium: 9.7 mg/dL (ref 8.7–10.2)
Chloride: 104 mmol/L (ref 96–106)
Creatinine, Ser: 0.99 mg/dL (ref 0.76–1.27)
Globulin, Total: 3.1 g/dL (ref 1.5–4.5)
Glucose: 89 mg/dL (ref 70–99)
Potassium: 4 mmol/L (ref 3.5–5.2)
Sodium: 140 mmol/L (ref 134–144)
Total Protein: 7.8 g/dL (ref 6.0–8.5)
eGFR: 97 mL/min/1.73 (ref >59)

## 2024-08-07 LAB — LIPID PANEL
Chol/HDL Ratio: 4.5 ratio (ref 0.0–5.0)
Cholesterol, Total: 201 mg/dL — ABNORMAL HIGH (ref 100–199)
HDL: 45 mg/dL (ref >39)
LDL Chol Calc (NIH): 131 mg/dL — ABNORMAL HIGH (ref 0–99)
Triglycerides: 137 mg/dL (ref 0–149)
VLDL Cholesterol Cal: 25 mg/dL (ref 5–40)

## 2024-08-07 LAB — TSH: TSH: 1.27 u[IU]/mL (ref 0.450–4.500)

## 2024-08-07 NOTE — Assessment & Plan Note (Signed)
Chronic.  Controlled.  Continue with current medication regimen of Atorvastatin 80mg daily. Refills sent today.  Labs ordered today.  Return to clinic in 6 months for reevaluation.  Call sooner if concerns arise.   

## 2024-08-07 NOTE — Assessment & Plan Note (Signed)
 Chronic. Controlled with pantoprazole nightly.

## 2024-08-07 NOTE — Assessment & Plan Note (Signed)
 Chronic.  Controlled.  Continue with current medication regimen of Fluoxetine 20mg  daily.  Refills sent today.  Labs ordered today.  Return to clinic in 6 months for reevaluation.  Call sooner if concerns arise.

## 2024-09-18 ENCOUNTER — Encounter: Payer: Self-pay | Admitting: Nurse Practitioner

## 2024-09-23 MED ORDER — TIZANIDINE HCL 4 MG PO TABS: 4.0000 mg | ORAL_TABLET | Freq: Four times a day (QID) | ORAL | 0 refills | Status: AC | PRN

## 2024-09-25 ENCOUNTER — Other Ambulatory Visit: Payer: Self-pay

## 2024-09-29 ENCOUNTER — Ambulatory Visit: Admitting: Family Medicine

## 2025-02-05 ENCOUNTER — Ambulatory Visit: Admitting: Nurse Practitioner
